# Patient Record
Sex: Female | Born: 1940 | Race: Black or African American | Hispanic: No | Smoking: Never smoker
Health system: Southern US, Community
[De-identification: ages and names within clinical notes are randomized; demographics above are authoritative.]

## PROBLEM LIST (undated history)

## (undated) DIAGNOSIS — M1990 Unspecified osteoarthritis, unspecified site: Secondary | ICD-10-CM

## (undated) DIAGNOSIS — M4800 Spinal stenosis, site unspecified: Secondary | ICD-10-CM

## (undated) DIAGNOSIS — E669 Obesity, unspecified: Secondary | ICD-10-CM

## (undated) DIAGNOSIS — I10 Essential (primary) hypertension: Secondary | ICD-10-CM

## (undated) DIAGNOSIS — G5601 Carpal tunnel syndrome, right upper limb: Secondary | ICD-10-CM

## (undated) DIAGNOSIS — N189 Chronic kidney disease, unspecified: Secondary | ICD-10-CM

## (undated) DIAGNOSIS — E785 Hyperlipidemia, unspecified: Secondary | ICD-10-CM

## (undated) DIAGNOSIS — D649 Anemia, unspecified: Secondary | ICD-10-CM

## (undated) HISTORY — PX: OTHER SURGICAL HISTORY: SHX169

## (undated) HISTORY — PX: JOINT REPLACEMENT: SHX530

## (undated) HISTORY — PX: BREAST SURGERY: SHX581

## (undated) HISTORY — PX: FRACTURE SURGERY: SHX138

## (undated) HISTORY — PX: COLONOSCOPY: SHX174

## (undated) HISTORY — PX: TUBAL LIGATION: SHX77

---

## 1969-08-06 HISTORY — PX: TUBAL LIGATION: SHX77

## 1985-08-06 HISTORY — PX: BREAST SURGERY: SHX581

## 1999-08-07 HISTORY — PX: FRACTURE SURGERY: SHX138

## 2000-01-17 ENCOUNTER — Ambulatory Visit (HOSPITAL_BASED_OUTPATIENT_CLINIC_OR_DEPARTMENT_OTHER): Admission: RE | Admit: 2000-01-17 | Discharge: 2000-01-17 | Payer: Self-pay | Admitting: Orthopedic Surgery

## 2001-08-27 ENCOUNTER — Other Ambulatory Visit: Admission: RE | Admit: 2001-08-27 | Discharge: 2001-08-27 | Payer: Self-pay | Admitting: *Deleted

## 2003-01-19 ENCOUNTER — Other Ambulatory Visit: Admission: RE | Admit: 2003-01-19 | Discharge: 2003-01-19 | Payer: Self-pay | Admitting: Obstetrics and Gynecology

## 2005-01-04 ENCOUNTER — Other Ambulatory Visit: Admission: RE | Admit: 2005-01-04 | Discharge: 2005-01-04 | Payer: Self-pay | Admitting: Family Medicine

## 2006-03-18 ENCOUNTER — Other Ambulatory Visit: Admission: RE | Admit: 2006-03-18 | Discharge: 2006-03-18 | Payer: Self-pay | Admitting: Family Medicine

## 2007-08-13 ENCOUNTER — Other Ambulatory Visit: Admission: RE | Admit: 2007-08-13 | Discharge: 2007-08-13 | Payer: Self-pay | Admitting: Family Medicine

## 2009-08-02 ENCOUNTER — Encounter: Admission: RE | Admit: 2009-08-02 | Discharge: 2009-08-02 | Payer: Self-pay | Admitting: Orthopedic Surgery

## 2010-12-22 NOTE — Op Note (Signed)
[place]. [facility]  Patient:    Sharon Fitzgerald, Sharon Fitzgerald                        MRN: [id number] Proc. Date: 01/17/00 Attending:  Artist Pais. Mina Marble, M.D. CC:         Artist Pais. Mina Marble, M.D. (2)                           Operative Report  PREOPERATIVE DIAGNOSIS:  Left fifth metacarpal fracture at the base.  POSTOPERATIVE DIAGNOSIS:  Left fifth metacarpal fracture at the base.  PROCEDURE:  Open reduction and internal fixation of above fracture using mini-fragment lag screw 1.5 x 12 mm, as well as a T-plate, four-hole, using four 1.5 x 9 mm screws.  SURGEON:  Artist Pais. Mina Marble, M.D.  ANESTHESIA:  General.  TOURNIQUET TIME:  One hour.  COMPLICATIONS:  None.  DRAINS:  None.  DESCRIPTION OF PROCEDURE:  The patient was taken to the operating room and after induction of adequate general anesthesia, the left upper extremity was prepped and draped in the usual sterile fashion.  An Esmarch was used to exsanguinate the limb, and the tourniquet was inflated to 250 mmHg.  At this point in time, a longitudinal incision was made centered between the fourth and fifth metacarpal shafts and bases.  The incision was taken down through the skin and subcutaneous tissues with care to carefully identify and retract a large branch of the ulnar sensory nerve.  Once this was done, the periosteum over the base of the fifth metacarpal was incised longitudinally, thus exposing a short oblique fracture.  The fracture was cleaned of clot and nonviable material using a small rongeur and a dental pick.  Once this was done, reduction was performed and held with a reduction clamp.  At this point in time, a 12 x 1.5 mm mini-fragment screw in lag fashion was placed across the fracture site to stabilize it.  At this point in time, intraoperative fluoroscopy showed a good, adequate reduction in both the AP and lateral plane.  At this point in time, a four-hole T-plate was placed at the base  with the T at the base and the shaft going distally, and fixed using four 9 x 1.5 mm screws.  Intraoperative x-rays showed the reduction in both the AP, lateral, and oblique views.  The wound was copiously irrigated, periosteum was closed with 4-0 Vicryl, and skin with running 4-0 Monocryl.  Steri-Strips, 4 x 4s, fluffs, and compressive dressing were applied.  The patient tolerated the procedure well and went to recovery in stable fashion. DD:  01/17/00 TD:  01/22/00 Job: [id number]

## 2013-01-05 ENCOUNTER — Encounter (HOSPITAL_COMMUNITY): Payer: Self-pay | Admitting: Pharmacy Technician

## 2013-01-08 ENCOUNTER — Encounter (HOSPITAL_COMMUNITY)
Admission: RE | Admit: 2013-01-08 | Discharge: 2013-01-08 | Disposition: A | Discharge status: Home / Self Care | Payer: Medicare PPO | Source: Ambulatory Visit | Attending: Orthopedic Surgery | Admitting: Orthopedic Surgery

## 2013-01-08 ENCOUNTER — Ambulatory Visit (HOSPITAL_COMMUNITY)
Admission: RE | Admit: 2013-01-08 | Discharge: 2013-01-08 | Disposition: A | Discharge status: Home / Self Care | Payer: Medicare PPO | Source: Ambulatory Visit | Attending: Orthopedic Surgery | Admitting: Orthopedic Surgery

## 2013-01-08 ENCOUNTER — Encounter (HOSPITAL_COMMUNITY): Payer: Self-pay

## 2013-01-08 DIAGNOSIS — Z01818 Encounter for other preprocedural examination: Secondary | ICD-10-CM | POA: Insufficient documentation

## 2013-01-08 DIAGNOSIS — I10 Essential (primary) hypertension: Secondary | ICD-10-CM | POA: Insufficient documentation

## 2013-01-08 HISTORY — DX: Unspecified osteoarthritis, unspecified site: M19.90

## 2013-01-08 HISTORY — DX: Essential (primary) hypertension: I10

## 2013-01-08 HISTORY — DX: Spinal stenosis, site unspecified: M48.00

## 2013-01-08 LAB — PROTIME-INR: INR: 1.17 (ref 0.00–1.49)

## 2013-01-08 LAB — APTT: aPTT: 31 seconds (ref 24–37)

## 2013-01-08 LAB — CBC
MCV: 91.2 fL (ref 78.0–100.0)
Platelets: 342 10*3/uL (ref 150–400)
RBC: 4.32 MIL/uL (ref 3.87–5.11)
RDW: 12.4 % (ref 11.5–15.5)
WBC: 9.6 10*3/uL (ref 4.0–10.5)

## 2013-01-08 LAB — URINE MICROSCOPIC-ADD ON

## 2013-01-08 LAB — URINALYSIS, ROUTINE W REFLEX MICROSCOPIC
Bilirubin Urine: NEGATIVE
Glucose, UA: NEGATIVE mg/dL
Ketones, ur: NEGATIVE mg/dL
Protein, ur: NEGATIVE mg/dL
Urobilinogen, UA: 0.2 mg/dL (ref 0.0–1.0)

## 2013-01-08 LAB — BASIC METABOLIC PANEL
CO2: 27 mEq/L (ref 19–32)
Calcium: 9.8 mg/dL (ref 8.4–10.5)
Chloride: 100 mEq/L (ref 96–112)
Creatinine, Ser: 1.21 mg/dL — ABNORMAL HIGH (ref 0.50–1.10)
GFR calc Af Amer: 51 mL/min — ABNORMAL LOW (ref >90)
Sodium: 136 mEq/L (ref 135–145)

## 2013-01-08 NOTE — Patient Instructions (Addendum)
Easton Sivertson Gilliand  01/08/2013   Your procedure is scheduled on: 01-15-2013  Report to [facility] at  10:30AM.  Call this number if you have problems the morning of surgery: [phone]   Remember:   Do not eat food or drink liquids after midnight.   Take these medicines the morning of surgery with A SIP OF WATER: no meds to take   Do not wear jewelry, make-up or nail polish.  Do not wear lotions, powders, or perfumes. You may wear deodorant.  Do not shave 48 hours prior to surgery. Men may shave face and neck.  Do not bring valuables to the hospital.  [facility] is not responsible   for any belongings or valuables.  Contacts, dentures or bridgework may not be worn into surgery.  Leave suitcase in the car. After surgery it may be brought to your room.  For patients admitted to the hospital, checkout time is 11:00 AM the day of  discharge.   Patients discharged the day of surgery will not be allowed to drive  home.  Name and phone number of your driver:   Special Instructions:  Shower using CHG 2 nights before surgery and the night before surgery.  If you shower the day of surgery use CHG.  Use special wash - you have one bottle of CHG for all showers.  You should use approximately 1/3 of the bottle for each shower.   Please read over the following fact sheets that you were given: MRSA Information incentive spirometry  fact sheet, blood fact sheet.

## 2013-01-08 NOTE — Progress Notes (Signed)
EKG 03-18-12 Dr. Clyde Canterbury on chart 01-05-13 medical clearance note on chart

## 2013-01-08 NOTE — H&P (Signed)
TOTAL KNEE ADMISSION H&P  Patient is being admitted for right total knee arthroplasty.  Subjective:  Chief Complaint:   Bilateral knee pain.  HPI: Sharon Fitzgerald, 72 y.o. female, has a history of pain and functional disability in the right knee due to arthritis and has failed non-surgical conservative treatments for greater than 12 weeks to includeNSAID's and/or analgesics, corticosteriod injections and activity modification.  Onset of symptoms was gradual, starting >10 years ago with gradually worsening course since that time. The patient noted no past surgery on the right knee(s).  Patient currently rates pain in the right knee(s) at 9 out of 10 with activity. Patient has worsening of pain with activity and weight bearing, pain that interferes with activities of daily living, pain with passive range of motion, crepitus and sitting for long periods.  Patient has evidence of periarticular osteophytes and joint space narrowing by imaging studies. There is no active infection.  Risks, benefits and expectations were discussed with the patient. Patient understand the risks, benefits and expectations and wishes to proceed with surgery.   D/C Plans:     Home with HHPT  Post-op Meds:    Rx given for ASA, Zanaflex, Iron, Colace and MiraLax  Tranexamic Acid:   To be given  Decadron:    To be given  FYI:    ASA post-op    Past Medical History  Diagnosis Date  . Hypertension   . Arthritis   . Spinal stenosis     sees Dr. Shon Baton    Past Surgical History  Procedure Laterality Date  . Tubal ligation    . Left hand surgury from fracture    . Breast surgery Bilateral     benign fibroid cyst    No prescriptions prior to admission   No Known Allergies   History  Substance Use Topics  . Smoking status: Never Smoker   . Smokeless tobacco: Never Used  . Alcohol Use: Yes     Comment: very rare       Review of Systems  Constitutional: Negative.   HENT: Negative.   Eyes: Negative.    Respiratory: Negative.   Cardiovascular: Negative.   Gastrointestinal: Negative.   Genitourinary: Negative.   Musculoskeletal: Positive for joint pain.  Skin: Negative.   Neurological: Negative.   Endo/Heme/Allergies: Negative.   Psychiatric/Behavioral: Negative.     Objective:  Physical Exam  Constitutional: She is oriented to person, place, and time. She appears well-developed.  HENT:  Head: Normocephalic and atraumatic.  Mouth/Throat: Oropharynx is clear and moist.  Eyes: Pupils are equal, round, and reactive to light.  Neck: Neck supple. No JVD present. No tracheal deviation present. No thyromegaly present.  Cardiovascular: Normal rate, normal heart sounds and intact distal pulses.   Respiratory: Effort normal and breath sounds normal. No stridor. No respiratory distress. She has no wheezes.  GI: Soft. There is no tenderness. There is no guarding.  Musculoskeletal:       Right knee: She exhibits decreased range of motion, swelling, abnormal alignment (mild valgus) and bony tenderness. She exhibits no effusion, no ecchymosis, no deformity, no laceration and no erythema. Tenderness found.  Lymphadenopathy:    She has no cervical adenopathy.  Neurological: She is alert and oriented to person, place, and time.  Skin: Skin is warm and dry.  Psychiatric: She has a normal mood and affect.    Vital signs in last 24 hours: Temp:  [98.6 F (37 C)] 98.6 F (37 C) (06/05 1133) Pulse Rate:  [  86] 86 (06/05 1133) Resp:  [16] 16 (06/05 1133) BP: (169)/(94) 169/94 mmHg (06/05 1133) SpO2:  [97 %] 97 % (06/05 1133) Weight:  [81.647 kg (180 lb)] 81.647 kg (180 lb) (06/05 1133)   Imaging Review Plain radiographs demonstrate severe degenerative joint disease of the right knee(s). The overall alignment ismild valgus. The bone quality appears to be good for age and reported activity level.  Assessment/Plan:  End stage arthritis, right knee   The patient history, physical examination,  clinical judgment of the provider and imaging studies are consistent with end stage degenerative joint disease of the right knee(s) and total knee arthroplasty is deemed medically necessary. The treatment options including medical management, injection therapy arthroscopy and arthroplasty were discussed at length. The risks and benefits of total knee arthroplasty were presented and reviewed. The risks due to aseptic loosening, infection, stiffness, patella tracking problems, thromboembolic complications and other imponderables were discussed. The patient acknowledged the explanation, agreed to proceed with the plan and consent was signed. Patient is being admitted for inpatient treatment for surgery, pain control, PT, OT, prophylactic antibiotics, VTE prophylaxis, progressive ambulation and ADL's and discharge planning. The patient is planning to be discharged home with home health services.    Anastasio Auerbach Pierson Vantol   PAC  01/08/2013, 4:36 PM

## 2013-01-08 NOTE — Progress Notes (Signed)
BMET UA micro results sent to Dr. Charlann Boxer via epic

## 2013-01-10 LAB — URINE CULTURE

## 2013-01-12 NOTE — Progress Notes (Signed)
Fax received and placed on pt chart per dr Charlann Boxer cipro 500 mg bid x 3 days called in for pt. To pharmacy

## 2013-01-12 NOTE — Progress Notes (Signed)
Urine culture results sent to dr Charlann Boxer fax by epic

## 2013-01-14 NOTE — Progress Notes (Signed)
Spoke with pt by phone aware surgery time changed to 1400, clear liquids midnight until 0800 am, then nothing by mouth arrive 1130 am 01-15-13 [facility] short stay

## 2013-01-15 ENCOUNTER — Inpatient Hospital Stay (HOSPITAL_COMMUNITY)
Admission: RE | Admit: 2013-01-15 | Discharge: 2013-01-16 | DRG: 470 | Disposition: A | Discharge status: Home Health | Payer: Medicare PPO | Source: Ambulatory Visit | Attending: Orthopedic Surgery | Admitting: Orthopedic Surgery

## 2013-01-15 ENCOUNTER — Encounter (HOSPITAL_COMMUNITY): Payer: Self-pay | Admitting: *Deleted

## 2013-01-15 ENCOUNTER — Encounter (HOSPITAL_COMMUNITY)
Admission: RE | Disposition: A | Discharge status: Home Health | Payer: Self-pay | Source: Ambulatory Visit | Attending: Orthopedic Surgery

## 2013-01-15 ENCOUNTER — Inpatient Hospital Stay (HOSPITAL_COMMUNITY): Payer: Medicare PPO | Admitting: Anesthesiology

## 2013-01-15 ENCOUNTER — Encounter (HOSPITAL_COMMUNITY): Payer: Self-pay | Admitting: Anesthesiology

## 2013-01-15 DIAGNOSIS — I10 Essential (primary) hypertension: Secondary | ICD-10-CM | POA: Diagnosis present

## 2013-01-15 DIAGNOSIS — E669 Obesity, unspecified: Secondary | ICD-10-CM | POA: Diagnosis present

## 2013-01-15 DIAGNOSIS — M1710 Unilateral primary osteoarthritis, unspecified knee: Principal | ICD-10-CM | POA: Diagnosis present

## 2013-01-15 DIAGNOSIS — Z96659 Presence of unspecified artificial knee joint: Secondary | ICD-10-CM

## 2013-01-15 DIAGNOSIS — D500 Iron deficiency anemia secondary to blood loss (chronic): Secondary | ICD-10-CM | POA: Diagnosis not present

## 2013-01-15 DIAGNOSIS — Z96651 Presence of right artificial knee joint: Secondary | ICD-10-CM

## 2013-01-15 HISTORY — PX: TOTAL KNEE ARTHROPLASTY: SHX125

## 2013-01-15 LAB — TYPE AND SCREEN
ABO/RH(D): A POS
Antibody Screen: NEGATIVE

## 2013-01-15 SURGERY — ARTHROPLASTY, KNEE, TOTAL
Anesthesia: Spinal | Site: Knee | Laterality: Right | Wound class: Clean

## 2013-01-15 MED ORDER — DEXAMETHASONE SODIUM PHOSPHATE 10 MG/ML IJ SOLN
10.0000 mg | Freq: Once | INTRAMUSCULAR | Status: DC
  Filled 2013-01-15: qty 1

## 2013-01-15 MED ORDER — BUPIVACAINE LIPOSOME 1.3 % IJ SUSP
20.0000 mL | Freq: Once | INTRAMUSCULAR | Status: DC
  Filled 2013-01-15: qty 20

## 2013-01-15 MED ORDER — ASPIRIN EC 325 MG PO TBEC
325.0000 mg | DELAYED_RELEASE_TABLET | Freq: Two times a day (BID) | ORAL | Status: DC
  Filled 2013-01-15 (×4): qty 1

## 2013-01-15 MED ORDER — POLYETHYLENE GLYCOL 3350 17 G PO PACK
17.0000 g | PACK | Freq: Two times a day (BID) | ORAL | Status: DC
  Administered 2013-01-15 – 2013-01-16 (×2): 17 g via ORAL
  Filled 2013-01-15: qty 1

## 2013-01-15 MED ORDER — CEFAZOLIN SODIUM-DEXTROSE 2-3 GM-% IV SOLR
2.0000 g | INTRAVENOUS | Status: AC
  Administered 2013-01-15: 2 g via INTRAVENOUS

## 2013-01-15 MED ORDER — MENTHOL 3 MG MT LOZG: 1.0000 | LOZENGE | OROMUCOSAL | Status: DC | PRN

## 2013-01-15 MED ORDER — SODIUM CHLORIDE 0.9 % IV SOLN
INTRAVENOUS | Status: DC
  Administered 2013-01-15: 19:00:00 via INTRAVENOUS
  Filled 2013-01-15 (×4): qty 1000

## 2013-01-15 MED ORDER — BUPIVACAINE-EPINEPHRINE 0.25% -1:200000 IJ SOLN
INTRAMUSCULAR | Status: DC | PRN
  Administered 2013-01-15: 25 mL

## 2013-01-15 MED ORDER — ZOLPIDEM TARTRATE 5 MG PO TABS
5.0000 mg | ORAL_TABLET | Freq: Every evening | ORAL | Status: DC | PRN
  Administered 2013-01-15: 5 mg via ORAL
  Filled 2013-01-15: qty 1

## 2013-01-15 MED ORDER — METOCLOPRAMIDE HCL 5 MG/ML IJ SOLN: 5.0000 mg | Freq: Three times a day (TID) | INTRAMUSCULAR | Status: DC | PRN

## 2013-01-15 MED ORDER — TRANEXAMIC ACID 100 MG/ML IV SOLN
1000.0000 mg | Freq: Once | INTRAVENOUS | Status: AC
  Administered 2013-01-15: 1000 mg via INTRAVENOUS
  Filled 2013-01-15: qty 10

## 2013-01-15 MED ORDER — CEFAZOLIN SODIUM-DEXTROSE 2-3 GM-% IV SOLR
2.0000 g | Freq: Four times a day (QID) | INTRAVENOUS | Status: AC
  Administered 2013-01-15 – 2013-01-16 (×2): 2 g via INTRAVENOUS
  Filled 2013-01-15 (×2): qty 50

## 2013-01-15 MED ORDER — FLEET ENEMA 7-19 GM/118ML RE ENEM: 1.0000 | ENEMA | Freq: Once | RECTAL | Status: AC | PRN

## 2013-01-15 MED ORDER — ONDANSETRON HCL 4 MG PO TABS: 4.0000 mg | ORAL_TABLET | Freq: Four times a day (QID) | ORAL | Status: DC | PRN

## 2013-01-15 MED ORDER — METHOCARBAMOL 100 MG/ML IJ SOLN
500.0000 mg | Freq: Four times a day (QID) | INTRAVENOUS | Status: DC | PRN
  Filled 2013-01-15: qty 5

## 2013-01-15 MED ORDER — ONDANSETRON HCL 4 MG/2ML IJ SOLN: 4.0000 mg | Freq: Four times a day (QID) | INTRAMUSCULAR | Status: DC | PRN

## 2013-01-15 MED ORDER — METHOCARBAMOL 500 MG PO TABS: 500.0000 mg | ORAL_TABLET | Freq: Four times a day (QID) | ORAL | Status: DC | PRN

## 2013-01-15 MED ORDER — CONJ ESTROG-MEDROXYPROGEST ACE 0.625-2.5 MG PO TABS
1.0000 | ORAL_TABLET | Freq: Every day | ORAL | Status: DC
  Filled 2013-01-15: qty 1

## 2013-01-15 MED ORDER — BUPIVACAINE LIPOSOME 1.3 % IJ SUSP
INTRAMUSCULAR | Status: DC | PRN
  Administered 2013-01-15: 20 mL

## 2013-01-15 MED ORDER — CELECOXIB 200 MG PO CAPS
200.0000 mg | ORAL_CAPSULE | Freq: Two times a day (BID) | ORAL | Status: DC
  Administered 2013-01-15 – 2013-01-16 (×2): 200 mg via ORAL
  Filled 2013-01-15 (×3): qty 1

## 2013-01-15 MED ORDER — HYDROMORPHONE HCL PF 1 MG/ML IJ SOLN: 0.2500 mg | INTRAMUSCULAR | Status: DC | PRN

## 2013-01-15 MED ORDER — PNEUMOCOCCAL VAC POLYVALENT 25 MCG/0.5ML IJ INJ
0.5000 mL | INJECTION | INTRAMUSCULAR | Status: DC
  Filled 2013-01-15 (×2): qty 0.5

## 2013-01-15 MED ORDER — DEXAMETHASONE SODIUM PHOSPHATE 10 MG/ML IJ SOLN
10.0000 mg | Freq: Once | INTRAMUSCULAR | Status: AC
  Administered 2013-01-15: 10 mg via INTRAVENOUS

## 2013-01-15 MED ORDER — KETOROLAC TROMETHAMINE 30 MG/ML IJ SOLN
INTRAMUSCULAR | Status: DC | PRN
  Administered 2013-01-15: 30 mg via INTRAVENOUS

## 2013-01-15 MED ORDER — BISACODYL 10 MG RE SUPP: 10.0000 mg | Freq: Every day | RECTAL | Status: DC | PRN

## 2013-01-15 MED ORDER — HYDROMORPHONE HCL PF 1 MG/ML IJ SOLN
0.5000 mg | INTRAMUSCULAR | Status: DC | PRN
  Administered 2013-01-15: 0.5 mg via INTRAVENOUS
  Filled 2013-01-15: qty 1

## 2013-01-15 MED ORDER — FENTANYL CITRATE 0.05 MG/ML IJ SOLN
INTRAMUSCULAR | Status: DC | PRN
  Administered 2013-01-15: 100 ug via INTRAVENOUS

## 2013-01-15 MED ORDER — DIPHENHYDRAMINE HCL 25 MG PO CAPS: 25.0000 mg | ORAL_CAPSULE | Freq: Four times a day (QID) | ORAL | Status: DC | PRN

## 2013-01-15 MED ORDER — METOCLOPRAMIDE HCL 10 MG PO TABS: 5.0000 mg | ORAL_TABLET | Freq: Three times a day (TID) | ORAL | Status: DC | PRN

## 2013-01-15 MED ORDER — SODIUM CHLORIDE 0.9 % IJ SOLN
INTRAMUSCULAR | Status: DC | PRN
  Administered 2013-01-15: 14 mL via INTRAVENOUS

## 2013-01-15 MED ORDER — PROPOFOL INFUSION 10 MG/ML OPTIME
INTRAVENOUS | Status: DC | PRN
  Administered 2013-01-15: 75 ug/kg/min via INTRAVENOUS

## 2013-01-15 MED ORDER — CHLORTHALIDONE 25 MG PO TABS
25.0000 mg | ORAL_TABLET | Freq: Every day | ORAL | Status: DC
  Administered 2013-01-16: 25 mg via ORAL
  Filled 2013-01-15: qty 1

## 2013-01-15 MED ORDER — DOCUSATE SODIUM 100 MG PO CAPS
100.0000 mg | ORAL_CAPSULE | Freq: Two times a day (BID) | ORAL | Status: DC
  Administered 2013-01-15 – 2013-01-16 (×2): 100 mg via ORAL

## 2013-01-15 MED ORDER — ONDANSETRON HCL 4 MG/2ML IJ SOLN
INTRAMUSCULAR | Status: DC | PRN
  Administered 2013-01-15: 4 mg via INTRAVENOUS

## 2013-01-15 MED ORDER — PHENOL 1.4 % MT LIQD: 1.0000 | OROMUCOSAL | Status: DC | PRN

## 2013-01-15 MED ORDER — ALUM & MAG HYDROXIDE-SIMETH 200-200-20 MG/5ML PO SUSP: 30.0000 mL | ORAL | Status: DC | PRN

## 2013-01-15 MED ORDER — HYDROCODONE-ACETAMINOPHEN 7.5-325 MG PO TABS
1.0000 | ORAL_TABLET | ORAL | Status: DC
  Administered 2013-01-15: 1 via ORAL
  Administered 2013-01-16: 2 via ORAL
  Administered 2013-01-16 (×3): 1 via ORAL
  Filled 2013-01-15: qty 1
  Filled 2013-01-15: qty 2
  Filled 2013-01-15 (×3): qty 1

## 2013-01-15 MED ORDER — LACTATED RINGERS IV SOLN
INTRAVENOUS | Status: DC | PRN
  Administered 2013-01-15 (×3): via INTRAVENOUS

## 2013-01-15 MED ORDER — MIDAZOLAM HCL 5 MG/5ML IJ SOLN
INTRAMUSCULAR | Status: DC | PRN
  Administered 2013-01-15: 2 mg via INTRAVENOUS

## 2013-01-15 MED ORDER — FERROUS SULFATE 325 (65 FE) MG PO TABS
325.0000 mg | ORAL_TABLET | Freq: Three times a day (TID) | ORAL | Status: DC
  Administered 2013-01-16 (×2): 325 mg via ORAL
  Filled 2013-01-15 (×4): qty 1

## 2013-01-15 SURGICAL SUPPLY — 55 items
BAG ZIPLOCK 12X15 (MISCELLANEOUS) ×2 IMPLANT
BANDAGE ELASTIC 6 VELCRO ST LF (GAUZE/BANDAGES/DRESSINGS) ×2 IMPLANT
BANDAGE ESMARK 6X9 LF (GAUZE/BANDAGES/DRESSINGS) ×1 IMPLANT
BLADE SAW SGTL 13.0X1.19X90.0M (BLADE) ×2 IMPLANT
BNDG ESMARK 6X9 LF (GAUZE/BANDAGES/DRESSINGS) ×2
BOWL SMART MIX CTS (DISPOSABLE) ×2 IMPLANT
CAPT RP KNEE ×2 IMPLANT
CEMENT HV SMART SET (Cement) ×4 IMPLANT
CLOTH BEACON ORANGE TIMEOUT ST (SAFETY) ×2 IMPLANT
CUFF TOURN SGL QUICK 34 (TOURNIQUET CUFF) ×1
CUFF TRNQT CYL 34X4X40X1 (TOURNIQUET CUFF) ×1 IMPLANT
DECANTER SPIKE VIAL GLASS SM (MISCELLANEOUS) ×2 IMPLANT
DERMABOND ADVANCED (GAUZE/BANDAGES/DRESSINGS) ×1
DERMABOND ADVANCED .7 DNX12 (GAUZE/BANDAGES/DRESSINGS) ×1 IMPLANT
DRAPE EXTREMITY T 121X128X90 (DRAPE) ×2 IMPLANT
DRAPE POUCH INSTRU U-SHP 10X18 (DRAPES) ×2 IMPLANT
DRAPE U-SHAPE 47X51 STRL (DRAPES) ×2 IMPLANT
DRSG AQUACEL AG ADV 3.5X10 (GAUZE/BANDAGES/DRESSINGS) ×2 IMPLANT
DRSG TEGADERM 4X4.75 (GAUZE/BANDAGES/DRESSINGS) ×2 IMPLANT
DURAPREP 26ML APPLICATOR (WOUND CARE) ×2 IMPLANT
ELECT REM PT RETURN 9FT ADLT (ELECTROSURGICAL) ×2
ELECTRODE REM PT RTRN 9FT ADLT (ELECTROSURGICAL) ×1 IMPLANT
EVACUATOR 1/8 PVC DRAIN (DRAIN) ×2 IMPLANT
FACESHIELD LNG OPTICON STERILE (SAFETY) ×10 IMPLANT
GAUZE SPONGE 2X2 8PLY STRL LF (GAUZE/BANDAGES/DRESSINGS) ×1 IMPLANT
GLOVE BIOGEL PI IND STRL 7.5 (GLOVE) ×1 IMPLANT
GLOVE BIOGEL PI IND STRL 8 (GLOVE) ×1 IMPLANT
GLOVE BIOGEL PI INDICATOR 7.5 (GLOVE) ×1
GLOVE BIOGEL PI INDICATOR 8 (GLOVE) ×1
GLOVE ECLIPSE 8.0 STRL XLNG CF (GLOVE) ×2 IMPLANT
GLOVE ORTHO TXT STRL SZ7.5 (GLOVE) ×4 IMPLANT
GOWN BRE IMP PREV XXLGXLNG (GOWN DISPOSABLE) ×2 IMPLANT
GOWN STRL NON-REIN LRG LVL3 (GOWN DISPOSABLE) ×2 IMPLANT
HANDPIECE INTERPULSE COAX TIP (DISPOSABLE) ×1
KIT BASIN OR (CUSTOM PROCEDURE TRAY) ×2 IMPLANT
MANIFOLD NEPTUNE II (INSTRUMENTS) ×2 IMPLANT
NDL SAFETY ECLIPSE 18X1.5 (NEEDLE) ×1 IMPLANT
NEEDLE HYPO 18GX1.5 SHARP (NEEDLE) ×1
NS IRRIG 1000ML POUR BTL (IV SOLUTION) ×4 IMPLANT
PACK TOTAL JOINT (CUSTOM PROCEDURE TRAY) ×2 IMPLANT
POSITIONER SURGICAL ARM (MISCELLANEOUS) ×2 IMPLANT
SET HNDPC FAN SPRY TIP SCT (DISPOSABLE) ×1 IMPLANT
SET PAD KNEE POSITIONER (MISCELLANEOUS) ×2 IMPLANT
SPONGE GAUZE 2X2 STER 10/PKG (GAUZE/BANDAGES/DRESSINGS) ×1
SUCTION FRAZIER 12FR DISP (SUCTIONS) ×2 IMPLANT
SUT MNCRL AB 4-0 PS2 18 (SUTURE) ×2 IMPLANT
SUT VIC AB 1 CT1 36 (SUTURE) ×2 IMPLANT
SUT VIC AB 2-0 CT1 27 (SUTURE) ×3
SUT VIC AB 2-0 CT1 TAPERPNT 27 (SUTURE) ×3 IMPLANT
SUT VLOC 180 0 24IN GS25 (SUTURE) ×2 IMPLANT
SYR 50ML LL SCALE MARK (SYRINGE) ×2 IMPLANT
TOWEL OR 17X26 10 PK STRL BLUE (TOWEL DISPOSABLE) ×4 IMPLANT
TRAY FOLEY CATH 14FRSI W/METER (CATHETERS) ×2 IMPLANT
WATER STERILE IRR 1500ML POUR (IV SOLUTION) ×2 IMPLANT
WRAP KNEE MAXI GEL POST OP (GAUZE/BANDAGES/DRESSINGS) ×2 IMPLANT

## 2013-01-15 NOTE — Anesthesia Postprocedure Evaluation (Signed)
  Anesthesia Post-op Note  Patient: Sharon Fitzgerald  Procedure(s) Performed: Procedure(s) (LRB): RIGHT TOTAL KNEE ARTHROPLASTY (Right)  Patient Location: PACU  Anesthesia Type: SAB  Level of Consciousness: awake and alert   Airway and Oxygen Therapy: Patient Spontanous Breathing  Post-op Pain: mild  Post-op Assessment: Post-op Vital signs reviewed, Patient's Cardiovascular Status Stable, Respiratory Function Stable, Patent Airway and No signs of Nausea or vomiting  Last Vitals:  Filed Vitals:   01/15/13 1715  BP: 155/86  Pulse: 71  Temp: 36.6 C  Resp: 16    Post-op Vital Signs: stable   Complications: No apparent anesthesia complications

## 2013-01-15 NOTE — Preoperative (Signed)
Beta Blockers   Reason not to administer Beta Blockers:Not Applicable 

## 2013-01-15 NOTE — Anesthesia Procedure Notes (Signed)
Spinal  Patient location during procedure: OR Start time: 01/15/2013 1:46 PM End time: 01/15/2013 1:50 PM Staffing Performed by: anesthesiologist  Preanesthetic Checklist Completed: patient identified, site marked, surgical consent, pre-op evaluation, timeout performed, IV checked, risks and benefits discussed and monitors and equipment checked Spinal Block Patient position: sitting Prep: ChloraPrep Patient monitoring: heart rate, continuous pulse ox and blood pressure Location: L3-4 Injection technique: single-shot Needle Needle type: Quincke  Needle gauge: 22 G Needle length: 9 cm Assessment Sensory level: T8 Additional Notes Expiration date of kit checked and confirmed. Patient tolerated procedure well, without complications.

## 2013-01-15 NOTE — Interval H&P Note (Signed)
History and Physical Interval Note:  01/15/2013 1:33 PM  Sharon Fitzgerald  has presented today for surgery, with the diagnosis of RIGHT KNEE OA  The various methods of treatment have been discussed with the patient and family. After consideration of risks, benefits and other options for treatment, the patient has consented to  Procedure(s): RIGHT TOTAL KNEE ARTHROPLASTY (Right) as a surgical intervention .  The patient's history has been reviewed, patient examined, no change in status, stable for surgery.  I have reviewed the patient's chart and labs.  Questions were answered to the patient's satisfaction.     Shelda Pal

## 2013-01-15 NOTE — Transfer of Care (Signed)
Immediate Anesthesia Transfer of Care Note  Patient: Sharon Fitzgerald  Procedure(s) Performed: Procedure(s): RIGHT TOTAL KNEE ARTHROPLASTY (Right)  Patient Location: PACU  Anesthesia Type:Regional  Level of Consciousness: awake, alert  and oriented  Airway & Oxygen Therapy: Patient Spontanous Breathing and Patient connected to face mask oxygen  Post-op Assessment: Report given to PACU RN and Post -op Vital signs reviewed and stable  Post vital signs: Reviewed and stable  Complications: No apparent anesthesia complications

## 2013-01-15 NOTE — Anesthesia Preprocedure Evaluation (Addendum)
Anesthesia Evaluation  Patient identified by MRN, date of birth, ID band Patient awake    Reviewed: Allergy & Precautions, H&P , NPO status , Patient's Chart, lab work & pertinent test results  Airway       Dental   Pulmonary neg pulmonary ROS,          Cardiovascular hypertension,     Neuro/Psych negative neurological ROS  negative psych ROS   GI/Hepatic negative GI ROS, Neg liver ROS,   Endo/Other  negative endocrine ROS  Renal/GU negative Renal ROS     Musculoskeletal negative musculoskeletal ROS (+)   Abdominal   Peds  Hematology negative hematology ROS (+)   Anesthesia Other Findings   Reproductive/Obstetrics negative OB ROS                         Anesthesia Physical Anesthesia Plan  ASA: II  Anesthesia Plan:    Post-op Pain Management:    Induction: Intravenous  Airway Management Planned:   Additional Equipment:   Intra-op Plan:   Post-operative Plan:   Informed Consent:   Dental advisory given  Plan Discussed with: CRNA  Anesthesia Plan Comments:         Anesthesia Quick Evaluation

## 2013-01-15 NOTE — Op Note (Signed)
NAME:  Sharon Fitzgerald [facility]                      MEDICAL RECORD NO.:  [id number]                             FACILITY:  [facility]      PHYSICIAN:  Madlyn Frankel. Charlann Boxer, M.D.  DATE OF BIRTH:  Jul 01, 1941      DATE OF PROCEDURE:  01/15/2013                                     OPERATIVE REPORT         PREOPERATIVE DIAGNOSIS:  Right knee osteoarthritis.      POSTOPERATIVE DIAGNOSIS:  Right knee osteoarthritis.      FINDINGS:  The patient was noted to have complete loss of cartilage and   bone-on-bone arthritis with associated osteophytes in the lateral and patellofemoral compartments of   the knee with a significant synovitis and associated effusion.      PROCEDURE:  Right total knee replacement.      COMPONENTS USED:  DePuy rotating platform posterior stabilized knee   system, a size 2.5 femur, 3 tibia, 12.5 mm PS insert, and 35 patellar   button.      SURGEON:  Madlyn Frankel. Charlann Boxer, M.D.      ASSISTANT:  Lanney Gins, PA-C.      ANESTHESIA:  Spinal.      SPECIMENS:  None.      COMPLICATION:  None.      DRAINS:  One Hemovac.  EBL: <100cc      TOURNIQUET TIME:   Total Tourniquet Time Documented: Thigh (Right) - 44 minutes Total: Thigh (Right) - 44 minutes  .      The patient was stable to the recovery room.      INDICATION FOR PROCEDURE:  Sharon Fitzgerald is a 72 y.o. female patient of   mine.  The patient had been seen, evaluated, and treated conservatively in the   office with medication, activity modification, and injections.  The patient had   radiographic changes of bone-on-bone arthritis with endplate sclerosis and osteophytes noted.      The patient failed conservative measures including medication, injections, and activity modification, and at this point was ready for more definitive measures.   Based on the radiographic changes and failed conservative measures, the patient   decided to proceed with total knee replacement.  Risks of infection,   DVT, component failure, need  for revision surgery, postop course, and   expectations were all   discussed and reviewed.  Consent was obtained for benefit of pain   relief.      PROCEDURE IN DETAIL:  The patient was brought to the operative theater.   Once adequate anesthesia, preoperative antibiotics, 2 gm of Ancef administered, the patient was positioned supine with the right thigh tourniquet placed.  The  right lower extremity was prepped and draped in sterile fashion.  A time-   out was performed identifying the patient, planned procedure, and   extremity.      The right lower extremity was placed in the [facility] leg holder.  The leg was   exsanguinated, tourniquet elevated to 250 mmHg.  A midline incision was   made followed by median parapatellar arthrotomy.  Following initial   exposure, attention was  first directed to the patella.  Precut   measurement was noted to be 22 mm.  I resected down to 13-14 mm and used a   35 patellar button to restore patellar height as well as cover the cut   surface.      The lug holes were drilled and a metal shim was placed to protect the   patella from retractors and saw blades.      At this point, attention was now directed to the femur.  The femoral   canal was opened with a drill, irrigated to try to prevent fat emboli.  An   intramedullary rod was passed at 5 degrees valgus, 10 mm of bone was   resected off the distal femur.  Following this resection, the tibia was   subluxated anteriorly.  Using the extramedullary guide, 6 mm of bone was resected off   the proximal lateral tibia.  We confirmed the gap would be   stable medially and laterally with a 10 mm insert as well as confirmed   the cut was perpendicular in the coronal plane, checking with an alignment rod.      Once this was done, I sized the femur to be a size 2.5 in the anterior-   posterior dimension, chose a standard component based on medial and   lateral dimension.  The size 2.5 rotation block was then  pinned in   position anterior referenced using the C-clamp to set rotation.  The   anterior, posterior, and  chamfer cuts were made without difficulty nor   notching making certain that I was along the anterior cortex to help   with flexion gap stability.      The final box cut was made off the lateral aspect of distal femur.      At this point, the tibia was sized to be a size 3, the size 3 tray was   then pinned in position through the medial third of the tubercle,   drilled, and keel punched.  Trial reduction was now carried with a 2.5 femur,  3 tibia, a 12.5 mm insert, and the 35 patella botton.  The knee was brought to   extension, full extension with good flexion stability with the patella   tracking through the trochlea without application of pressure.  Given   all these findings, the trial components removed.  Final components were   opened and cement was mixed.  The knee was irrigated with normal saline   solution and pulse lavage.  The synovial lining was   then injected with 0.25% Marcaine with epinephrine and 1 cc of Toradol,   total of 61 cc.      The knee was irrigated.  Final implants were then cemented onto clean and   dried cut surfaces of bone with the knee brought to extension with a 12.5 mm trial insert.      Once the cement had fully cured, the excess cement was removed   throughout the knee.  I confirmed I was satisfied with the range of   motion and stability, and the final 12.5 mm PS insert was chosen.  It was   placed into the knee.      The tourniquet had been let down at 44 minutes.  No significant   hemostasis required.  The medium Hemovac drain was placed deep.  The   extensor mechanism was then reapproximated using #1 Vicryl with the knee   in flexion.  The  remaining wound was closed with 2-0 Vicryl and running 4-0 Monocryl.   The knee was cleaned, dried, dressed sterilely using Dermabond and   Aquacel dressing.  Drain site dressed separately.  The  patient was then   brought to recovery room in stable condition, tolerating the procedure   well.   Please note that Physician Assistant, Lanney Gins, was present for the entirety of the case, and was utilized for pre-operative positioning, peri-operative retractor management, general facilitation of the procedure.  He was also utilized for primary wound closure at the end of the case.              Madlyn Frankel Charlann Boxer, M.D.

## 2013-01-16 ENCOUNTER — Encounter (HOSPITAL_COMMUNITY): Payer: Self-pay | Admitting: Orthopedic Surgery

## 2013-01-16 DIAGNOSIS — E669 Obesity, unspecified: Secondary | ICD-10-CM | POA: Diagnosis present

## 2013-01-16 DIAGNOSIS — D500 Iron deficiency anemia secondary to blood loss (chronic): Secondary | ICD-10-CM | POA: Diagnosis not present

## 2013-01-16 LAB — CBC
HCT: 32 % — ABNORMAL LOW (ref 36.0–46.0)
Hemoglobin: 10.3 g/dL — ABNORMAL LOW (ref 12.0–15.0)
MCH: 29 pg (ref 26.0–34.0)
MCHC: 32.2 g/dL (ref 30.0–36.0)
MCV: 90.1 fL (ref 78.0–100.0)

## 2013-01-16 LAB — BASIC METABOLIC PANEL
BUN: 17 mg/dL (ref 6–23)
Calcium: 8.6 mg/dL (ref 8.4–10.5)
Creatinine, Ser: 1.07 mg/dL (ref 0.50–1.10)
GFR calc non Af Amer: 51 mL/min — ABNORMAL LOW (ref >90)
Glucose, Bld: 131 mg/dL — ABNORMAL HIGH (ref 70–99)

## 2013-01-16 MED ORDER — TIZANIDINE HCL 4 MG PO CAPS: 4.0000 mg | ORAL_CAPSULE | Freq: Three times a day (TID) | ORAL | Status: DC | PRN

## 2013-01-16 MED ORDER — FERROUS SULFATE 325 (65 FE) MG PO TABS: 325.0000 mg | ORAL_TABLET | Freq: Three times a day (TID) | ORAL | Status: DC

## 2013-01-16 MED ORDER — MEDROXYPROGESTERONE ACETATE 2.5 MG PO TABS
2.5000 mg | ORAL_TABLET | Freq: Every day | ORAL | Status: DC
  Filled 2013-01-16: qty 1

## 2013-01-16 MED ORDER — POLYETHYLENE GLYCOL 3350 17 G PO PACK: 17.0000 g | PACK | Freq: Two times a day (BID) | ORAL | Status: DC

## 2013-01-16 MED ORDER — ASPIRIN 325 MG PO TBEC: 325.0000 mg | DELAYED_RELEASE_TABLET | Freq: Two times a day (BID) | ORAL | Status: DC

## 2013-01-16 MED ORDER — ESTROGENS CONJUGATED 0.625 MG PO TABS
0.6250 mg | ORAL_TABLET | Freq: Every day | ORAL | Status: DC
  Filled 2013-01-16: qty 1

## 2013-01-16 MED ORDER — DSS 100 MG PO CAPS: 100.0000 mg | ORAL_CAPSULE | Freq: Two times a day (BID) | ORAL | Status: DC

## 2013-01-16 MED ORDER — HYDROCODONE-ACETAMINOPHEN 7.5-325 MG PO TABS: 1.0000 | ORAL_TABLET | ORAL | Status: DC

## 2013-01-16 NOTE — Progress Notes (Signed)
Patient d/c home,stable.- Nain Rudd RN 

## 2013-01-16 NOTE — Progress Notes (Signed)
Utilization review completed.  

## 2013-01-16 NOTE — Progress Notes (Signed)
D/c instructions given, patient verbalized understanding,prescriptions given,patient is stable,alert and oriented, denies pain.Hulda Marin RN

## 2013-01-16 NOTE — Evaluation (Signed)
Physical Therapy Evaluation Patient Details Name: Sharon Fitzgerald MRN: [id number] DOB: [birth year] Today's Date: 01/16/2013 Time: 1478-2956 PT Time Calculation (min): 29 min  PT Assessment / Plan / Recommendation Clinical Impression  Pt s/p R TKR presents with decreased R LE strength/ROM, post op pain and spinal stenosis limiting functional mobility    PT Assessment  Patient needs continued PT services    Follow Up Recommendations  Home health PT    Does the patient have the potential to tolerate intense rehabilitation      Barriers to Discharge None      Equipment Recommendations  None recommended by PT    Recommendations for Other Services OT consult   Frequency 7X/week    Precautions / Restrictions Precautions Precautions: Knee Restrictions Weight Bearing Restrictions: No Other Position/Activity Restrictions: WBAT   Pertinent Vitals/Pain 6/10 with activity; premed, cold pack provided      Mobility  Bed Mobility Bed Mobility: Sit to Supine Supine to Sit: 4: Min assist Sit to Supine: 4: Min assist Details for Bed Mobility Assistance: flat bed, no cues needed Transfers Transfers: Sit to Stand;Stand to Sit Sit to Stand: 4: Min guard;From bed;From chair/3-in-1;With upper extremity assist Stand to Sit: 4: Min guard;With upper extremity assist;To chair/3-in-1;To bed Details for Transfer Assistance: cues for hand placement Ambulation/Gait Ambulation/Gait Assistance: 4: Min assist Ambulation Distance (Feet): 27 Feet Assistive device: Rolling walker Ambulation/Gait Assistance Details: cues for sequence, posture, position from RW and stride length Gait Pattern: Step-to pattern General Gait Details: ltd by c/o back fatigue 2* spinal stenosis Stairs: No    Exercises Total Joint Exercises Ankle Circles/Pumps: AROM;10 reps;Supine;Both Quad Sets: AROM;Both;10 reps;Supine Heel Slides: AAROM;15 reps;Supine;Right Straight Leg Raises: AROM;AAROM;Right;Supine;15 reps   PT  Diagnosis: Difficulty walking  PT Problem List: Decreased strength;Decreased range of motion;Decreased activity tolerance;Decreased mobility;Decreased knowledge of use of DME;Pain PT Treatment Interventions: DME instruction;Gait training;Stair training;Functional mobility training;Therapeutic activities;Therapeutic exercise;Patient/family education   PT Goals Acute Rehab PT Goals PT Goal Formulation: With patient Time For Goal Achievement: 01/21/13 Potential to Achieve Goals: Good Pt will go Supine/Side to Sit: with supervision PT Goal: Supine/Side to Sit - Progress: Goal set today Pt will go Sit to Supine/Side: with supervision PT Goal: Sit to Supine/Side - Progress: Goal set today Pt will go Sit to Stand: with supervision PT Goal: Sit to Stand - Progress: Goal set today Pt will go Stand to Sit: with supervision PT Goal: Stand to Sit - Progress: Goal set today Pt will Ambulate: 51 - 150 feet;with supervision;with rolling walker PT Goal: Ambulate - Progress: Goal set today Pt will Go Up / Down Stairs: 6-9 stairs;with min assist;with least restrictive assistive device PT Goal: Up/Down Stairs - Progress: Goal set today  Visit Information  Last PT Received On: 01/16/13 Assistance Needed: +1    Subjective Data  Subjective: I'm getting the other knee done in a few weeks Patient Stated Goal: Resume previous lifestyle with decreased pain   Prior Functioning  Home Living Lives With: Alone Available Help at Discharge: Friend(s) (coming to stay, 24/7) Type of Home: House Home Access: Stairs to enter [employer] of Steps: 6 Entrance Stairs-Rails: Right;Left;Can reach both Home Layout: Multi-level Alternate Level Stairs-Number of Steps: 6 Alternate Level Stairs-Rails: Right;Left;Can reach both Bathroom Shower/Tub: Health visitor: Pharmacist, community:  (handicapped in hall) Home Adaptive Equipment: Straight cane;Walker - rolling Additional  Comments: Pt states can borrow RW from family member Prior Function Level of Independence: Independent Able to Take Stairs?: Yes Driving: Yes  Vocation: Retired Musician: No difficulties Dominant Hand: Right    Cognition  Cognition Arousal/Alertness: Awake/alert Behavior During Therapy: WFL for tasks assessed/performed Overall Cognitive Status:  (decreased safety)    Extremity/Trunk Assessment Right Upper Extremity Assessment RUE ROM/Strength/Tone: WFL for tasks assessed Left Upper Extremity Assessment LUE ROM/Strength/Tone: WFL for tasks assessed Right Lower Extremity Assessment RLE ROM/Strength/Tone: Deficits RLE ROM/Strength/Tone Deficits: 3/5 quads with AAROM -10 - 95 Left Lower Extremity Assessment LLE ROM/Strength/Tone: WFL for tasks assessed   Balance    End of Session PT - End of Session Equipment Utilized During Treatment: Gait belt Activity Tolerance: Patient tolerated treatment well Patient left: in chair;with call bell/phone within reach;with family/visitor present Nurse Communication: Mobility status  GP     Sharon Fitzgerald 01/16/2013, 11:37 AM

## 2013-01-16 NOTE — Progress Notes (Signed)
Physical Therapy Treatment Patient Details Name: Sharon Fitzgerald MRN: [id number] DOB: 1940-12-17 Today's Date: 01/16/2013 Time: 4098-1191 PT Time Calculation (min): 23 min  PT Assessment / Plan / Recommendation Comments on Treatment Session  Reviewed car transfers and ltd home ther ex program with pt     Follow Up Recommendations  Home health PT     Does the patient have the potential to tolerate intense rehabilitation     Barriers to Discharge        Equipment Recommendations  None recommended by PT    Recommendations for Other Services OT consult  Frequency 7X/week   Plan Discharge plan remains appropriate    Precautions / Restrictions Precautions Precautions: Knee Restrictions Weight Bearing Restrictions: No Other Position/Activity Restrictions: WBAT   Pertinent Vitals/Pain Min c/o pain    Mobility  Bed Mobility Bed Mobility: Supine to Sit;Sit to Supine Supine to Sit: 5: Supervision Sit to Supine: 5: Supervision Details for Bed Mobility Assistance: cues for saftey awareness Transfers Transfers: Sit to Stand;Stand to Sit Sit to Stand: 5: Supervision;From chair/3-in-1;From bed;With armrests;With upper extremity assist Stand to Sit: 5: Supervision;To bed;With armrests;With upper extremity assist Details for Transfer Assistance: cues for hand placement Ambulation/Gait Ambulation/Gait Assistance: 4: Min guard;5: Supervision Ambulation Distance (Feet): 150 Feet (and 20) Assistive device: Rolling walker Ambulation/Gait Assistance Details: cues for pace, posture, and position from RW Gait Pattern: Step-to pattern Stairs: Yes Stairs Assistance: 4: Min guard Stair Management Technique: Two rails;Forwards;Step to pattern Number of Stairs: 5 Wheelchair Mobility Wheelchair Mobility: No    Exercises     PT Diagnosis:    PT Problem List:   PT Treatment Interventions:     PT Goals Acute Rehab PT Goals PT Goal Formulation: With patient Time For Goal Achievement:  01/21/13 Potential to Achieve Goals: Good Pt will go Supine/Side to Sit: with supervision PT Goal: Supine/Side to Sit - Progress: Met Pt will go Sit to Supine/Side: with supervision PT Goal: Sit to Supine/Side - Progress: Met Pt will go Sit to Stand: with supervision PT Goal: Sit to Stand - Progress: Met Pt will go Stand to Sit: with supervision PT Goal: Stand to Sit - Progress: Met Pt will Ambulate: 51 - 150 feet;with supervision;with rolling walker PT Goal: Ambulate - Progress: Met Pt will Go Up / Down Stairs: 6-9 stairs;with min assist;with least restrictive assistive device PT Goal: Up/Down Stairs - Progress: Met  Visit Information  Last PT Received On: 01/16/13 Assistance Needed: +1    Subjective Data  Patient Stated Goal: Resume previous lifestyle with decreased pain   Cognition  Cognition Arousal/Alertness: Awake/alert Behavior During Therapy: WFL for tasks assessed/performed Overall Cognitive Status: Within Functional Limits for tasks assessed    Balance     End of Session PT - End of Session Equipment Utilized During Treatment: Gait belt Activity Tolerance: Patient tolerated treatment well Patient left: in bed;with call bell/phone within reach;with family/visitor present Nurse Communication: Mobility status   GP     Amanpreet Delmont 01/16/2013, 1:53 PM

## 2013-01-16 NOTE — Evaluation (Signed)
Occupational Therapy Evaluation Patient Details Name: Sharon Fitzgerald MRN: [id number] DOB: [birth year] Today's Date: 01/16/2013 Time: 4098-1191 OT Time Calculation (min): 25 min  OT Assessment / Plan / Recommendation Clinical Impression  This 72 year old female was admitted for R TKA. Will follow in acute for safety during toilet transfers.  Pt will have 24/7 at home iniitiallly and does not want 3:1 commode    OT Assessment  Patient needs continued OT Services    Follow Up Recommendations  No OT follow up    Barriers to Discharge      Equipment Recommendations  None recommended by OT    Recommendations for Other Services    Frequency  Min 2X/week    Precautions / Restrictions Precautions Precautions: Knee Restrictions Weight Bearing Restrictions: No   Pertinent Vitals/Pain R knee sore    ADL  Toilet Transfer: Min guard Toilet Transfer Method: Sit to stand Toilet Transfer Equipment: Comfort height toilet;Grab bars Toileting - Clothing Manipulation and Hygiene: Supervision/safety Where Assessed - Engineer, mining and Hygiene: Sit to stand from 3-in-1 or toilet Tub/Shower Transfer: Min guard Tub/Shower Transfer Method: Science writer: Walk in shower Equipment Used: Rolling walker Transfers/Ambulation Related to ADLs: Pt has urgency due to spinal stenosis.  When in bathroom, turned quickly leaving walker behind and quicklyl sat on commode.  Cued for safety,but pt carried through with unsafe transfer. Therapist had difficulty guarding due to her positioning walker between [place].   Explained that she has urgency.  I reinforced that a fall would be a much worse outcome than urine spillage.  Discussed 3:1 for bedside due to urgency, but she doesn't want this. Pt vomited after using commode.   ADL Comments: Pt is very flexible:  set up/occasional min A for LB adls.      OT Diagnosis: Generalized weakness  OT Problem List: Decreased safety  awareness;Decreased strength OT Treatment Interventions: Self-care/ADL training;Patient/family education;DME and/or AE instruction   OT Goals Acute Rehab OT Goals OT Goal Formulation: With patient Time For Goal Achievement: 01/19/13 Potential to Achieve Goals: Good ADL Goals Pt Will Transfer to Toilet: Regular height toilet;with modified independence;Ambulation (sink next to commode, no cues for safety) ADL Goal: Toilet Transfer - Progress: Goal set today  Visit Information  Last OT Received On: 01/16/13 Assistance Needed: +1    Subjective Data  Subjective: My toilet is regular but I won't have a problem.  I have a high one in the hall Patient Stated Goal: home, get back to being independent   Prior Functioning     Home Living Lives With: Alone Available Help at Discharge: Friend(s) (coming to stay, 24/7) Bathroom Shower/Tub: Walk-in Stage manager: Pharmacist, community:  (handicapped in hall) Prior Function Level of Independence: Independent Communication Communication: No difficulties         Vision/Perception     Copywriter, advertising Arousal/Alertness: Awake/alert Behavior During Therapy: WFL for tasks assessed/performed Overall Cognitive Status:  (decreased safety)    Extremity/Trunk Assessment Right Upper Extremity Assessment RUE ROM/Strength/Tone: WFL for tasks assessed Left Upper Extremity Assessment LUE ROM/Strength/Tone: WFL for tasks assessed     Mobility Bed Mobility Bed Mobility: Sit to Supine Sit to Supine: 4: Min assist Details for Bed Mobility Assistance: flat bed, no cues needed Transfers Transfers: Sit to Stand;Stand to Sit Sit to Stand: 4: Min guard;From bed;From chair/3-in-1;With upper extremity assist Stand to Sit: 4: Min guard;With upper extremity assist;To chair/3-in-1;To bed Details for Transfer Assistance: cues for hand placement  Exercise     Balance     End of Session OT - End of Session Activity  Tolerance: Patient tolerated treatment well Patient left: in bed;with call bell/phone within reach Nurse Communication:  (pt vomited after using commode)  GO     Coltrane Tugwell 01/16/2013, 10:51 AM Marica Otter, OTR/L [phone] 01/16/2013

## 2013-01-16 NOTE — Care Management Note (Addendum)
    Page 1 of 2   01/16/2013     3:05:11 PM   CARE MANAGEMENT NOTE 01/16/2013  Patient:  Sharon Fitzgerald, Sharon Fitzgerald   Account Number:  [id number]  Date Initiated:  01/16/2013  Documentation initiated by:  Colleen Can  Subjective/Objective Assessment:   DX RT KINEE OSTEOARTHRITIS; TOTAL KNEE REPLACEMENT     Action/Plan:   CM SPOKE WITH PATIENT. PLANS ARE FOR PATIENT TO RETURN TO HER HOME IN [place] WHERE FRIEND WILL BE CAREGIVER. [facility] WILL PROVIDE HH SERVICES.   Anticipated DC Date:  01/16/2013   Anticipated DC Plan:  HOME W HOME HEALTH SERVICES      DC Planning Services  CM consult      [facility] Choice  HOME HEALTH  DURABLE MEDICAL EQUIPMENT   Choice offered to / List presented to:  C-1 Patient   DME arranged  [facility]      DME agency  [facility]     HH arranged  HH-2 PT      [facility] agency  [facility]   Status of service:  Completed, signed off Medicare Important Message given?   (If response is "NO", the following Medicare IM given date fields will be blank) Date Medicare IM given:   Date Additional Medicare IM given:    Discharge Disposition:  HOME W HOME HEALTH SERVICES  Per UR Regulation:    If discussed at Long Length of Stay Meetings, dates discussed:    Comments:  01/16/2013 [facility] Alyxandra Tenbrink BSN RN CCM [phone] [facility] WILL PROVIDE hh SERVICES WITH START DATE OF DAY AFTER PT IS DISCHARGED. RW HAS BEEN DELIVERED TO PT'S ROOM BY [facility] REP.  01/16/2013 Colleen Can BSN RN CCM [phone] Pt will need RW. Christoper Allegra is Neurosurgeon. Spoke with Christoper Allegra intake-[place] who requested that DME order, face sheet and H&P be faxed to [phone]. Confirmation received.

## 2013-01-16 NOTE — Progress Notes (Signed)
   Subjective: 1 Day Post-Op Procedure(s) (LRB): RIGHT TOTAL KNEE ARTHROPLASTY (Right)   Patient reports pain as mild, pain well controlled. Didn't sleep well, but no other events throughout the night. Very pleased with how she feels after having spinal anesthesia. Ready to be discharged home.  Objective:   VITALS:   Filed Vitals:   01/16/13 0535  BP: 120/72  Pulse: 63  Temp: 97.8 F (36.6 C)  Resp: 16    Neurovascular intact Dorsiflexion/Plantar flexion intact Incision: dressing C/D/I No cellulitis present Compartment soft  LABS  Recent Labs  01/16/13 0449  HGB 10.3*  HCT 32.0*  WBC 14.6*  PLT 285     Recent Labs  01/16/13 0449  NA 137  K 3.1*  BUN 17  CREATININE 1.07  GLUCOSE 131*     Assessment/Plan: 1 Day Post-Op Procedure(s) (LRB): RIGHT TOTAL KNEE ARTHROPLASTY (Right) HV drain d/c'ed Foley cath d/c'ed Advance diet Up with therapy D/C IV fluids Discharge home with home health Follow up in 2 weeks at [facility]. Follow up with OLIN,Shateka Petrea D in 2 weeks.  Contact information:  [facility] [address], [address] [place] [address] [phone]    Expected ABLA  Treated with iron and will observe  Obese (BMI 30-39.9) Estimated body mass index is 31.89 kg/(m^2) as calculated from the following:   Height as of this encounter: 5\' 3"  (1.6 m).   Weight as of this encounter: 81.647 kg (180 lb). Patient also counseled that weight may inhibit the healing process Patient counseled that losing weight will help with future health issues       Anastasio Auerbach. Rachyl Wuebker   PAC  01/16/2013, 8:46 AM

## 2013-01-21 NOTE — Discharge Summary (Signed)
Physician Discharge Summary  Patient ID: Sharon Fitzgerald MRN: [id number] DOB/AGE: 09-18-1940 72 y.o.  Admit date: 01/15/2013 Discharge date: 01/16/2013   Procedures:  Procedure(s) (LRB): RIGHT TOTAL KNEE ARTHROPLASTY (Right)  Attending Physician:  Dr. Durene Romans   Admission Diagnoses:   Bilateral knee pain, right greater than left.  Discharge Diagnoses:  Principal Problem:   S/P right TKA Active Problems:   Expected blood loss anemia   Obese  Past Medical History  Diagnosis Date  . Hypertension   . Arthritis   . Spinal stenosis     sees Dr. Shon Baton    HPI: Sharon Fitzgerald, 72 y.o. female, has a history of pain and functional disability in the right knee due to arthritis and has failed non-surgical conservative treatments for greater than 12 weeks to includeNSAID's and/or analgesics, corticosteriod injections and activity modification. Onset of symptoms was gradual, starting >10 years ago with gradually worsening course since that time. The patient noted no past surgery on the right knee(s). Patient currently rates pain in the right knee(s) at 9 out of 10 with activity. Patient has worsening of pain with activity and weight bearing, pain that interferes with activities of daily living, pain with passive range of motion, crepitus and sitting for long periods. Patient has evidence of periarticular osteophytes and joint space narrowing by imaging studies. There is no active infection. Risks, benefits and expectations were discussed with the patient. Patient understand the risks, benefits and expectations and wishes to proceed with surgery.   PCP: Elie Confer, MD   Discharged Condition: good  Hospital Course:  Patient underwent the above stated procedure on 01/15/2013. Patient tolerated the procedure well and brought to the recovery room in good condition and subsequently to the floor.  POD #1 BP: 120/72 ; Pulse: 63 ; Temp: 97.8 F (36.6 C) ; Resp: 16  Pt's foley was  removed, as well as the hemovac drain removed. IV was changed to a saline lock. Patient reports pain as mild, pain well controlled. Didn't sleep well, but no other events throughout the night. Very pleased with how she feels after having spinal anesthesia. Ready to be discharged home. Neurovascular intact, dorsiflexion/plantar flexion intact, incision: dressing C/D/I, no cellulitis present and compartment soft.   LABS  Basename  01/16/13    0449   HGB  10.3  HCT  32.0    Discharge Exam: General appearance: alert, cooperative and no distress Extremities: Homans sign is negative, no sign of DVT, no edema, redness or tenderness in the calves or thighs and no ulcers, gangrene or trophic changes  Disposition:   Home-Health Care Svc with follow up in 2 weeks   Follow-up Information   Follow up with Shelda Pal, MD. Schedule an appointment as soon as possible for a visit in 2 weeks.   Contact information:   [address] [place] [address] [phone]       Discharge Orders   Future Orders Complete By Expires     Call MD / Call 911  As directed     Comments:      If you experience chest pain or shortness of breath, CALL 911 and be transported to the hospital emergency room.  If you develope a fever above 101 F, pus (white drainage) or increased drainage or redness at the wound, or calf pain, call your surgeon's office.    Change dressing  As directed     Comments:      Maintain surgical dressing for  10-14 days, then change the dressing daily with sterile 4 x 4 inch gauze dressing and tape. Keep the area dry and clean.    Constipation Prevention  As directed     Comments:      Drink plenty of fluids.  Prune juice may be helpful.  You may use a stool softener, such as Colace (over the counter) 100 mg twice a day.  Use MiraLax (over the counter) for constipation as needed.    Diet - low sodium heart healthy  As directed     Discharge instructions  As directed      Comments:      Maintain surgical dressing for 10-14 days, then replace with gauze and tape. Keep the area dry and clean until follow up. Follow up in 2 weeks at [facility]. Call with any questions or concerns.    Driving restrictions  As directed     Comments:      No driving for 4 weeks    Increase activity slowly as tolerated  As directed     TED hose  As directed     Comments:      Use stockings (TED hose) for 2 weeks on both leg(s).  You may remove them at night for sleeping.    Weight bearing as tolerated  As directed          Medication List    STOP taking these medications       aspirin 325 MG tablet     ciprofloxacin 500 MG tablet  Commonly known as:  CIPRO      TAKE these medications       aspirin 325 MG EC tablet  Take 1 tablet (325 mg total) by mouth 2 (two) times daily.     chlorthalidone 25 MG tablet  Commonly known as:  HYGROTON  Take 25 mg by mouth daily.     DSS 100 MG Caps  Take 100 mg by mouth 2 (two) times daily.     estrogen (conjugated)-medroxyprogesterone 0.625-2.5 MG per tablet  Commonly known as:  PREMPRO  Take 1 tablet by mouth daily.     ferrous sulfate 325 (65 FE) MG tablet  Take 1 tablet (325 mg total) by mouth 3 (three) times daily after meals.     HYDROCHLOROTHIAZIDE PO  Take 1 tablet by mouth every morning.     HYDROcodone-acetaminophen 7.5-325 MG per tablet  Commonly known as:  NORCO  Take 1-2 tablets by mouth every 4 (four) hours.     polyethylene glycol packet  Commonly known as:  MIRALAX / GLYCOLAX  Take 17 g by mouth 2 (two) times daily.     tiZANidine 4 MG capsule  Commonly known as:  ZANAFLEX  Take 1 capsule (4 mg total) by mouth 3 (three) times daily as needed for muscle spasms. Muscle spasms         Signed: Anastasio Auerbach. Sharon Fitzgerald   PAC  01/21/2013, 1:54 PM

## 2013-02-09 ENCOUNTER — Ambulatory Visit: Payer: Medicare PPO | Attending: Orthopedic Surgery | Admitting: Physical Therapy

## 2013-02-09 DIAGNOSIS — M25569 Pain in unspecified knee: Secondary | ICD-10-CM | POA: Insufficient documentation

## 2013-02-09 DIAGNOSIS — IMO0001 Reserved for inherently not codable concepts without codable children: Secondary | ICD-10-CM | POA: Insufficient documentation

## 2013-02-09 DIAGNOSIS — R5381 Other malaise: Secondary | ICD-10-CM | POA: Insufficient documentation

## 2013-02-09 DIAGNOSIS — R269 Unspecified abnormalities of gait and mobility: Secondary | ICD-10-CM | POA: Insufficient documentation

## 2013-02-09 DIAGNOSIS — Z96659 Presence of unspecified artificial knee joint: Secondary | ICD-10-CM | POA: Insufficient documentation

## 2013-02-11 ENCOUNTER — Ambulatory Visit: Payer: Medicare PPO | Admitting: Physical Therapy

## 2013-02-12 ENCOUNTER — Ambulatory Visit: Payer: Medicare PPO | Admitting: Physical Therapy

## 2013-02-16 ENCOUNTER — Ambulatory Visit: Payer: Medicare PPO | Admitting: Physical Therapy

## 2013-02-18 ENCOUNTER — Ambulatory Visit: Payer: Medicare PPO | Admitting: Physical Therapy

## 2013-02-19 ENCOUNTER — Ambulatory Visit: Payer: Medicare PPO | Admitting: Physical Therapy

## 2013-02-23 ENCOUNTER — Ambulatory Visit: Payer: Medicare PPO | Admitting: Physical Therapy

## 2013-02-25 ENCOUNTER — Ambulatory Visit: Payer: Medicare PPO | Admitting: Physical Therapy

## 2013-02-27 ENCOUNTER — Ambulatory Visit: Payer: Medicare PPO | Admitting: Physical Therapy

## 2013-03-02 ENCOUNTER — Ambulatory Visit: Payer: Medicare PPO | Admitting: Physical Therapy

## 2013-03-04 ENCOUNTER — Ambulatory Visit: Payer: Medicare PPO | Admitting: Physical Therapy

## 2013-03-06 ENCOUNTER — Ambulatory Visit: Payer: Medicare PPO | Attending: Orthopedic Surgery | Admitting: Physical Therapy

## 2013-03-06 DIAGNOSIS — R5381 Other malaise: Secondary | ICD-10-CM | POA: Insufficient documentation

## 2013-03-06 DIAGNOSIS — IMO0001 Reserved for inherently not codable concepts without codable children: Secondary | ICD-10-CM | POA: Insufficient documentation

## 2013-03-06 DIAGNOSIS — R269 Unspecified abnormalities of gait and mobility: Secondary | ICD-10-CM | POA: Insufficient documentation

## 2013-03-06 DIAGNOSIS — M25569 Pain in unspecified knee: Secondary | ICD-10-CM | POA: Insufficient documentation

## 2013-03-06 DIAGNOSIS — Z96659 Presence of unspecified artificial knee joint: Secondary | ICD-10-CM | POA: Insufficient documentation

## 2013-03-09 ENCOUNTER — Ambulatory Visit: Payer: Medicare PPO | Admitting: Physical Therapy

## 2013-03-11 ENCOUNTER — Ambulatory Visit: Payer: Medicare PPO | Admitting: Physical Therapy

## 2013-03-16 ENCOUNTER — Ambulatory Visit: Payer: Medicare PPO | Admitting: Physical Therapy

## 2013-03-18 ENCOUNTER — Ambulatory Visit: Payer: Medicare PPO | Admitting: Physical Therapy

## 2013-03-24 ENCOUNTER — Ambulatory Visit: Payer: Medicare PPO | Admitting: Physical Therapy

## 2013-03-26 ENCOUNTER — Ambulatory Visit: Payer: Medicare PPO | Admitting: Physical Therapy

## 2013-03-30 ENCOUNTER — Ambulatory Visit: Payer: Medicare PPO | Admitting: Physical Therapy

## 2013-04-01 ENCOUNTER — Ambulatory Visit: Payer: Medicare PPO | Admitting: Physical Therapy

## 2013-04-20 NOTE — H&P (Signed)
History of Present Illness The patient is a 72 year old female who presents today for follow up of their back. The patient is being followed for their central back pain. Symptoms reported today include: pain and weakness (right lower ext. ), while the patient does not report symptoms of: numbness or urinary incontinence. The patient states that they are doing poorly (had TKA holding back progress due to back). Current treatment includes: activity modification. The following medication has been used for pain control: Norco (takes 3 a day). The patient reports their current pain level to be moderate to severe.    Subjective Transcription  She presents today for follow up concerning her back. The patient recently had her total knee done on the right side and she is improving. She still requires the left one done but her back pain has gotten significantly worse. 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 I last saw her in May of 2014. 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    Allergies No Known Drug Allergies. 04/11/2012   Social History Alcohol use. current drinker; drinks wine; only occasionally per week Children. 2 Current work status. retired Financial planner (Currently). no Exercise. Exercises weekly; does other and gym / weights Illicit drug use. no Living situation. live alone Marital status. divorced Most recent primary occupation. 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 greater than 5 Pain Contract. no Previously in rehab. no Tobacco / smoke exposure. no Tobacco use. never smoker   Medication History Norco (5-325MG  Tablet, 1-2 Tablet Oral po q 6hrs prn pain, Taken starting 03/24/2013) Active. Amoxicillin (500MG  Tablet, 4 tablet(s) Oral po 1 hr prior to dental  procedure, Taken starting 03/18/2013) Active. Chlorthalidone (25MG  Tablet, Oral) Active. Bayer Aspirin ( Oral) Specific dose unknown - Active. Aspirin 325mg  Active. Colace Active. Miralax Active. Iron Active. Vitamin C ( Oral) Active. Vitamin D ( Oral) Specific dose unknown - Active. Centrum ( Oral) Active. Prempro ( Oral) Specific dose unknown - Active.   Objective Transcription  Clinically she remains neurologically intact. She has a well healed surgical scar over the knee. No previous surgical incision on her lumbar spine. She has pain with extension of the spine, relief with forward flexion, intact peripheral pulses. Compartments soft, nontender. She has chronic incontinence of urine, no incontinence of bowel. No shortness of breath or chest pain.    RADIOGRAPHS:  X-rays today, AP, lateral flexion and extension lumbar spine, demonstrate dynamic instability at L4-5 with static instability at L5-S1. She does have an extra lumbar vertebra which I am referring to as S1. On her MRI she has spinal stenosis at L4-5 and L5-S1. There is a minimal disc bulge at L3-4 with slight left central compression due to the facet arthrosis.      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 l Risks of surgery include, but are not limited to: Death, stroke, paralysis, nerve root damage/injury, bleeding, blood clots, loss of bowel/bladder control, sexual dysfunction, retrograde ejaculation, hardware failure, or malposition, spinal fluid leak, adjacent segment disease, non-union, need for further surgery, ongoing or worse pain, injury  to bladder, bowel and abdominal  contents, infection and recurrent disc herniation  l We have gone over the risks and benefits of surgery, which include infection, bleeding, nerve damage, death, stroke, paralysis, failure to heal, need for further surgery, ongoing or worse pain, loss of fixation, need for further surgery, CSF leak, loss of bowel or bladder control, ongoing or worse pain.    Plans Transcription(Jazzlin Clements Sheela Stack, MD; 04/01/2013 5:04 PM)  At this point in time we have had a long discussion about a lumbar decompression for her stenosis. I am concerned that with the decompression the spondylolisthesis can worsen especially at the L4-5 level. Therefore I have recommended once the decompression is complete an instrumented fusion. Her DEXA scan she indicates demonstrated osteopenia and if she has good bone quality I would do an instrumented fusion. If not I would just do an uninstrumented in situ fusion and brace after surgery. I have explained the risks to the patient and to her daughter which includes infection, bleeding, nerve damage, death, stroke, paralysis, failure to heal, need for further surgery, ongoing or worse pain, loss of bowel and bladder control, hardware complications, nonunion, adjacent segment disease. I would also since there is some disease at L3-5 also extend that probe up to the 3-4 level and if I felt it was significant extend the decompression to include the L3-4 level. We will get preoperative medical clearance and I will also have her primary care physician send me the DEXA scan results. She would like to do this in mid October which I think is fine so we will talk again in mid to late September prior to surgery. Because this is a multilevel fusion procedure I would also use a bone stimulator.

## 2013-04-23 ENCOUNTER — Encounter (HOSPITAL_COMMUNITY): Payer: Self-pay | Admitting: Pharmacy Technician

## 2013-04-24 ENCOUNTER — Encounter (HOSPITAL_COMMUNITY): Payer: Self-pay

## 2013-04-24 ENCOUNTER — Other Ambulatory Visit (HOSPITAL_COMMUNITY): Payer: Self-pay | Admitting: *Deleted

## 2013-04-24 ENCOUNTER — Encounter (HOSPITAL_COMMUNITY)
Admission: RE | Admit: 2013-04-24 | Discharge: 2013-04-24 | Disposition: A | Discharge status: Home / Self Care | Payer: Medicare PPO | Source: Ambulatory Visit | Attending: Orthopedic Surgery | Admitting: Orthopedic Surgery

## 2013-04-24 DIAGNOSIS — Z01812 Encounter for preprocedural laboratory examination: Secondary | ICD-10-CM | POA: Insufficient documentation

## 2013-04-24 DIAGNOSIS — Z01818 Encounter for other preprocedural examination: Secondary | ICD-10-CM | POA: Insufficient documentation

## 2013-04-24 LAB — CBC
Hemoglobin: 12.1 g/dL (ref 12.0–15.0)
MCV: 91.1 fL (ref 78.0–100.0)
Platelets: 335 10*3/uL (ref 150–400)
RBC: 4.14 MIL/uL (ref 3.87–5.11)
WBC: 9.5 10*3/uL (ref 4.0–10.5)

## 2013-04-24 LAB — BASIC METABOLIC PANEL
CO2: 26 mEq/L (ref 19–32)
Calcium: 9.7 mg/dL (ref 8.4–10.5)
Chloride: 101 mEq/L (ref 96–112)
Potassium: 3.9 mEq/L (ref 3.5–5.1)
Sodium: 138 mEq/L (ref 135–145)

## 2013-04-24 LAB — TYPE AND SCREEN
ABO/RH(D): A POS
Antibody Screen: NEGATIVE

## 2013-04-24 NOTE — Progress Notes (Signed)
Patient primary physician - Dr. Hyman Hopes with [facility] physicians Cardiologist - dr. Katrinka Blazing - ekg June 2014 will request records

## 2013-04-24 NOTE — Pre-Procedure Instructions (Signed)
Sharon Fitzgerald  04/24/2013   Your procedure is scheduled on:  April 30, 2013 at 7:30 AM  Report to [facility] at 5:30 AM.  Call this number if you have problems the morning of surgery: [phone]   Remember:   Do not eat food or drink liquids after midnight.   Take these medicines the morning of surgery with A SIP OF WATER: HYDROcodone-acetaminophen (NORCO/VICODIN) - if needed  Stop all Vitamins, Herbal medications, Aspirin and NSAIDS (Ibuprofen, Motrin, Aleve, Naproxen, etc.) as of today, 04/24/13.   Do not wear jewelry, make-up or nail polish.  Do not wear lotions, powders, or perfumes. You may wear deodorant.  Do not shave 48 hours prior to surgery.   Do not bring valuables to the hospital.  [facility] is not responsible                   for any belongings or valuables.  Contacts, dentures or bridgework may not be worn into surgery.  Leave suitcase in the car. After surgery it may be brought to your room.  For patients admitted to the hospital, checkout time is 11:00 AM the day of  discharge.     Special Instructions: Shower using CHG 2 nights before surgery and the night before surgery.  If you shower the day of surgery use CHG.  Use special wash - you have one bottle of CHG for all showers.  You should use approximately 1/3 of the bottle for each shower.   Please read over the following fact sheets that you were given: Pain Booklet, Coughing and Deep Breathing, Blood Transfusion Information, MRSA Information and Surgical Site Infection Prevention

## 2013-04-29 MED ORDER — ACETAMINOPHEN 10 MG/ML IV SOLN
1000.0000 mg | Freq: Four times a day (QID) | INTRAVENOUS | Status: DC
  Administered 2013-04-30: 1000 mg via INTRAVENOUS

## 2013-04-29 MED ORDER — DEXAMETHASONE SODIUM PHOSPHATE 4 MG/ML IJ SOLN
4.0000 mg | Freq: Once | INTRAMUSCULAR | Status: AC
  Administered 2013-04-30: 4 mg via INTRAVENOUS
  Filled 2013-04-29: qty 1

## 2013-04-29 MED ORDER — CEFAZOLIN SODIUM-DEXTROSE 2-3 GM-% IV SOLR
2.0000 g | INTRAVENOUS | Status: AC
  Administered 2013-04-30: 2 g via INTRAVENOUS
  Filled 2013-04-29: qty 50

## 2013-04-30 ENCOUNTER — Encounter (HOSPITAL_COMMUNITY): Payer: Self-pay | Admitting: Critical Care Medicine

## 2013-04-30 ENCOUNTER — Inpatient Hospital Stay (HOSPITAL_COMMUNITY): Payer: Medicare PPO | Admitting: Critical Care Medicine

## 2013-04-30 ENCOUNTER — Inpatient Hospital Stay (HOSPITAL_COMMUNITY): Payer: Medicare PPO

## 2013-04-30 ENCOUNTER — Inpatient Hospital Stay (HOSPITAL_COMMUNITY)
Admission: RE | Admit: 2013-04-30 | Discharge: 2013-05-02 | DRG: 460 | Disposition: A | Discharge status: Home Health | Payer: Medicare PPO | Source: Ambulatory Visit | Attending: Orthopedic Surgery | Admitting: Orthopedic Surgery

## 2013-04-30 ENCOUNTER — Encounter (HOSPITAL_COMMUNITY)
Admission: RE | Disposition: A | Discharge status: Home Health | Payer: Self-pay | Source: Ambulatory Visit | Attending: Orthopedic Surgery

## 2013-04-30 DIAGNOSIS — Z7982 Long term (current) use of aspirin: Secondary | ICD-10-CM

## 2013-04-30 DIAGNOSIS — Z6831 Body mass index (BMI) 31.0-31.9, adult: Secondary | ICD-10-CM

## 2013-04-30 DIAGNOSIS — M4310 Spondylolisthesis, site unspecified: Principal | ICD-10-CM | POA: Diagnosis present

## 2013-04-30 DIAGNOSIS — I10 Essential (primary) hypertension: Secondary | ICD-10-CM | POA: Diagnosis present

## 2013-04-30 DIAGNOSIS — E669 Obesity, unspecified: Secondary | ICD-10-CM | POA: Diagnosis present

## 2013-04-30 DIAGNOSIS — Z981 Arthrodesis status: Secondary | ICD-10-CM

## 2013-04-30 DIAGNOSIS — M5137 Other intervertebral disc degeneration, lumbosacral region: Secondary | ICD-10-CM | POA: Diagnosis present

## 2013-04-30 DIAGNOSIS — M51379 Other intervertebral disc degeneration, lumbosacral region without mention of lumbar back pain or lower extremity pain: Secondary | ICD-10-CM | POA: Diagnosis present

## 2013-04-30 DIAGNOSIS — Z79899 Other long term (current) drug therapy: Secondary | ICD-10-CM

## 2013-04-30 DIAGNOSIS — Z96659 Presence of unspecified artificial knee joint: Secondary | ICD-10-CM

## 2013-04-30 HISTORY — PX: LUMBAR LAMINECTOMY/DECOMPRESSION MICRODISCECTOMY: SHX5026

## 2013-04-30 SURGERY — LUMBAR LAMINECTOMY/DECOMPRESSION MICRODISCECTOMY 3 LEVELS
Anesthesia: General | Site: Spine Lumbar | Wound class: Clean

## 2013-04-30 MED ORDER — CEFAZOLIN SODIUM 1-5 GM-% IV SOLN
1.0000 g | Freq: Three times a day (TID) | INTRAVENOUS | Status: AC
  Administered 2013-04-30 – 2013-05-01 (×2): 1 g via INTRAVENOUS
  Filled 2013-04-30 (×2): qty 50

## 2013-04-30 MED ORDER — OXYCODONE HCL 5 MG PO TABS
10.0000 mg | ORAL_TABLET | ORAL | Status: DC | PRN
  Administered 2013-05-01 – 2013-05-02 (×4): 10 mg via ORAL
  Filled 2013-04-30 (×4): qty 2

## 2013-04-30 MED ORDER — PHENYLEPHRINE HCL 10 MG/ML IJ SOLN
INTRAMUSCULAR | Status: DC | PRN
  Administered 2013-04-30: 80 ug via INTRAVENOUS

## 2013-04-30 MED ORDER — METHOCARBAMOL 500 MG PO TABS
ORAL_TABLET | ORAL | Status: AC
  Filled 2013-04-30: qty 1

## 2013-04-30 MED ORDER — DEXAMETHASONE SODIUM PHOSPHATE 4 MG/ML IJ SOLN
4.0000 mg | Freq: Four times a day (QID) | INTRAMUSCULAR | Status: DC
  Filled 2013-04-30 (×11): qty 1

## 2013-04-30 MED ORDER — DOCUSATE SODIUM 100 MG PO CAPS
100.0000 mg | ORAL_CAPSULE | Freq: Two times a day (BID) | ORAL | Status: DC
  Administered 2013-04-30 – 2013-05-02 (×4): 100 mg via ORAL
  Filled 2013-04-30 (×6): qty 1

## 2013-04-30 MED ORDER — ZOLPIDEM TARTRATE 5 MG PO TABS: 5.0000 mg | ORAL_TABLET | Freq: Every evening | ORAL | Status: DC | PRN

## 2013-04-30 MED ORDER — HYDROMORPHONE HCL PF 1 MG/ML IJ SOLN
INTRAMUSCULAR | Status: AC
  Filled 2013-04-30: qty 1

## 2013-04-30 MED ORDER — MENTHOL 3 MG MT LOZG: 1.0000 | LOZENGE | OROMUCOSAL | Status: DC | PRN

## 2013-04-30 MED ORDER — MORPHINE SULFATE 2 MG/ML IJ SOLN: 1.0000 mg | INTRAMUSCULAR | Status: DC | PRN

## 2013-04-30 MED ORDER — ACETAMINOPHEN 10 MG/ML IV SOLN
1000.0000 mg | Freq: Four times a day (QID) | INTRAVENOUS | Status: AC
  Administered 2013-05-01 (×3): 1000 mg via INTRAVENOUS
  Filled 2013-04-30 (×4): qty 100

## 2013-04-30 MED ORDER — LIDOCAINE HCL (CARDIAC) 20 MG/ML IV SOLN
INTRAVENOUS | Status: DC | PRN
  Administered 2013-04-30: 100 mg via INTRAVENOUS

## 2013-04-30 MED ORDER — OXYCODONE HCL 5 MG/5ML PO SOLN: 5.0000 mg | Freq: Once | ORAL | Status: AC | PRN

## 2013-04-30 MED ORDER — SODIUM CHLORIDE 0.9 % IV SOLN: 250.0000 mL | INTRAVENOUS | Status: DC

## 2013-04-30 MED ORDER — MORPHINE SULFATE (PF) 1 MG/ML IV SOLN
INTRAVENOUS | Status: DC
  Administered 2013-04-30: 20:00:00 via INTRAVENOUS
  Administered 2013-05-01: 4 mg via INTRAVENOUS
  Administered 2013-05-01: 5 mg via INTRAVENOUS
  Administered 2013-05-01: 4 mg via INTRAVENOUS
  Filled 2013-04-30: qty 25

## 2013-04-30 MED ORDER — PROPOFOL 10 MG/ML IV BOLUS
INTRAVENOUS | Status: DC | PRN
  Administered 2013-04-30 (×2): 20 mg via INTRAVENOUS
  Administered 2013-04-30: 140 mg via INTRAVENOUS

## 2013-04-30 MED ORDER — DIPHENHYDRAMINE HCL 50 MG/ML IJ SOLN: 12.5000 mg | Freq: Four times a day (QID) | INTRAMUSCULAR | Status: DC | PRN

## 2013-04-30 MED ORDER — VANCOMYCIN HCL 500 MG IV SOLR
INTRAVENOUS | Status: DC | PRN
  Administered 2013-04-30: 500 mg

## 2013-04-30 MED ORDER — ACETAMINOPHEN 10 MG/ML IV SOLN
INTRAVENOUS | Status: AC
  Filled 2013-04-30: qty 100

## 2013-04-30 MED ORDER — LACTATED RINGERS IV SOLN
INTRAVENOUS | Status: DC | PRN
  Administered 2013-04-30 (×4): via INTRAVENOUS

## 2013-04-30 MED ORDER — THROMBIN 20000 UNITS EX SOLR
CUTANEOUS | Status: DC | PRN
  Administered 2013-04-30: 09:00:00 via TOPICAL

## 2013-04-30 MED ORDER — PHENOL 1.4 % MT LIQD: 1.0000 | OROMUCOSAL | Status: DC | PRN

## 2013-04-30 MED ORDER — INFLUENZA VAC SPLIT QUAD 0.5 ML IM SUSP
0.5000 mL | INTRAMUSCULAR | Status: DC
  Filled 2013-04-30: qty 0.5

## 2013-04-30 MED ORDER — LISINOPRIL-HYDROCHLOROTHIAZIDE 10-12.5 MG PO TABS: 1.0000 | ORAL_TABLET | Freq: Every day | ORAL | Status: DC

## 2013-04-30 MED ORDER — DIPHENHYDRAMINE HCL 12.5 MG/5ML PO ELIX: 12.5000 mg | ORAL_SOLUTION | Freq: Four times a day (QID) | ORAL | Status: DC | PRN

## 2013-04-30 MED ORDER — DEXAMETHASONE 4 MG PO TABS
4.0000 mg | ORAL_TABLET | Freq: Four times a day (QID) | ORAL | Status: DC
  Administered 2013-05-01 – 2013-05-02 (×6): 4 mg via ORAL
  Filled 2013-04-30 (×12): qty 1

## 2013-04-30 MED ORDER — LISINOPRIL 10 MG PO TABS
10.0000 mg | ORAL_TABLET | Freq: Every day | ORAL | Status: DC
  Administered 2013-04-30: 10 mg via ORAL
  Filled 2013-04-30 (×3): qty 1

## 2013-04-30 MED ORDER — ONDANSETRON HCL 4 MG/2ML IJ SOLN: 4.0000 mg | Freq: Four times a day (QID) | INTRAMUSCULAR | Status: DC | PRN

## 2013-04-30 MED ORDER — HYDROCHLOROTHIAZIDE 12.5 MG PO CAPS
12.5000 mg | ORAL_CAPSULE | Freq: Every day | ORAL | Status: DC
  Administered 2013-04-30: 12.5 mg via ORAL
  Filled 2013-04-30 (×3): qty 1

## 2013-04-30 MED ORDER — OXYCODONE HCL 5 MG PO TABS
ORAL_TABLET | ORAL | Status: AC
  Filled 2013-04-30: qty 1

## 2013-04-30 MED ORDER — MIDAZOLAM HCL 5 MG/5ML IJ SOLN
INTRAMUSCULAR | Status: DC | PRN
  Administered 2013-04-30: 2 mg via INTRAVENOUS

## 2013-04-30 MED ORDER — ONDANSETRON HCL 4 MG/2ML IJ SOLN
INTRAMUSCULAR | Status: DC | PRN
  Administered 2013-04-30: 4 mg via INTRAVENOUS

## 2013-04-30 MED ORDER — LACTATED RINGERS IV SOLN
INTRAVENOUS | Status: DC
  Administered 2013-05-01: 04:00:00 via INTRAVENOUS

## 2013-04-30 MED ORDER — FLEET ENEMA 7-19 GM/118ML RE ENEM: 1.0000 | ENEMA | Freq: Once | RECTAL | Status: AC | PRN

## 2013-04-30 MED ORDER — VANCOMYCIN HCL 500 MG IV SOLR
INTRAVENOUS | Status: AC
  Filled 2013-04-30: qty 500

## 2013-04-30 MED ORDER — SUCCINYLCHOLINE CHLORIDE 20 MG/ML IJ SOLN
INTRAMUSCULAR | Status: DC | PRN
  Administered 2013-04-30: 100 mg via INTRAVENOUS

## 2013-04-30 MED ORDER — 0.9 % SODIUM CHLORIDE (POUR BTL) OPTIME
TOPICAL | Status: DC | PRN
  Administered 2013-04-30: 1000 mL

## 2013-04-30 MED ORDER — BUPIVACAINE-EPINEPHRINE PF 0.25-1:200000 % IJ SOLN
INTRAMUSCULAR | Status: AC
  Filled 2013-04-30: qty 30

## 2013-04-30 MED ORDER — METHOCARBAMOL 500 MG PO TABS
500.0000 mg | ORAL_TABLET | Freq: Four times a day (QID) | ORAL | Status: DC | PRN
  Administered 2013-04-30 – 2013-05-01 (×2): 500 mg via ORAL
  Filled 2013-04-30 (×2): qty 1

## 2013-04-30 MED ORDER — HYDROMORPHONE HCL PF 1 MG/ML IJ SOLN
INTRAMUSCULAR | Status: AC
  Administered 2013-04-30: 0.25 mg via INTRAVENOUS
  Filled 2013-04-30: qty 1

## 2013-04-30 MED ORDER — BUPIVACAINE-EPINEPHRINE 0.25% -1:200000 IJ SOLN
INTRAMUSCULAR | Status: DC | PRN
  Administered 2013-04-30: 10 mL

## 2013-04-30 MED ORDER — HYDROMORPHONE HCL PF 1 MG/ML IJ SOLN
0.2500 mg | INTRAMUSCULAR | Status: DC | PRN
  Administered 2013-04-30: 0.25 mg via INTRAVENOUS
  Administered 2013-04-30: 0.5 mg via INTRAVENOUS

## 2013-04-30 MED ORDER — ARTIFICIAL TEARS OP OINT
TOPICAL_OINTMENT | OPHTHALMIC | Status: DC | PRN
  Administered 2013-04-30: 1 via OPHTHALMIC

## 2013-04-30 MED ORDER — SODIUM CHLORIDE 0.9 % IJ SOLN: 9.0000 mL | INTRAMUSCULAR | Status: DC | PRN

## 2013-04-30 MED ORDER — MIDAZOLAM HCL 2 MG/2ML IJ SOLN: 1.0000 mg | INTRAMUSCULAR | Status: DC | PRN

## 2013-04-30 MED ORDER — SODIUM CHLORIDE 0.9 % IJ SOLN: 3.0000 mL | INTRAMUSCULAR | Status: DC | PRN

## 2013-04-30 MED ORDER — NALOXONE HCL 0.4 MG/ML IJ SOLN: 0.4000 mg | INTRAMUSCULAR | Status: DC | PRN

## 2013-04-30 MED ORDER — ONDANSETRON HCL 4 MG/2ML IJ SOLN: 4.0000 mg | INTRAMUSCULAR | Status: DC | PRN

## 2013-04-30 MED ORDER — HEMOSTATIC AGENTS (NO CHARGE) OPTIME
TOPICAL | Status: DC | PRN
  Administered 2013-04-30: 1 via TOPICAL

## 2013-04-30 MED ORDER — FENTANYL CITRATE 0.05 MG/ML IJ SOLN: 50.0000 ug | Freq: Once | INTRAMUSCULAR | Status: DC

## 2013-04-30 MED ORDER — METHOCARBAMOL 100 MG/ML IJ SOLN
500.0000 mg | Freq: Four times a day (QID) | INTRAVENOUS | Status: DC | PRN
  Filled 2013-04-30: qty 5

## 2013-04-30 MED ORDER — PROPOFOL INFUSION 10 MG/ML OPTIME
INTRAVENOUS | Status: DC | PRN
  Administered 2013-04-30: 50 ug/kg/min via INTRAVENOUS

## 2013-04-30 MED ORDER — SODIUM CHLORIDE 0.9 % IJ SOLN
3.0000 mL | Freq: Two times a day (BID) | INTRAMUSCULAR | Status: DC
  Administered 2013-05-01: 3 mL via INTRAVENOUS

## 2013-04-30 MED ORDER — FENTANYL CITRATE 0.05 MG/ML IJ SOLN
INTRAMUSCULAR | Status: DC | PRN
  Administered 2013-04-30: 100 ug via INTRAVENOUS
  Administered 2013-04-30: 50 ug via INTRAVENOUS
  Administered 2013-04-30: 100 ug via INTRAVENOUS
  Administered 2013-04-30 (×5): 50 ug via INTRAVENOUS
  Administered 2013-04-30: 25 ug via INTRAVENOUS
  Administered 2013-04-30 (×2): 50 ug via INTRAVENOUS
  Administered 2013-04-30: 25 ug via INTRAVENOUS

## 2013-04-30 MED ORDER — THROMBIN 20000 UNITS EX SOLR
CUTANEOUS | Status: AC
  Filled 2013-04-30: qty 20000

## 2013-04-30 MED ORDER — OXYCODONE HCL 5 MG PO TABS
5.0000 mg | ORAL_TABLET | Freq: Once | ORAL | Status: AC | PRN
  Administered 2013-04-30: 5 mg via ORAL

## 2013-04-30 SURGICAL SUPPLY — 73 items
BUR EGG ELITE 4.0 (BURR) ×2 IMPLANT
BUR MATCHSTICK NEURO 3.0 LAGG (BURR) IMPLANT
CANISTER SUCTION 2500CC (MISCELLANEOUS) ×2 IMPLANT
CLOTH BEACON ORANGE TIMEOUT ST (SAFETY) ×2 IMPLANT
CLSR STERI-STRIP ANTIMIC 1/2X4 (GAUZE/BANDAGES/DRESSINGS) ×2 IMPLANT
CONNECTOR EXPEDIUM SFX SZ A6 (Orthopedic Implant) ×2 IMPLANT
CORDS BIPOLAR (ELECTRODE) ×2 IMPLANT
COVER SURGICAL LIGHT HANDLE (MISCELLANEOUS) ×2 IMPLANT
DERMABOND ADVANCED (GAUZE/BANDAGES/DRESSINGS)
DERMABOND ADVANCED .7 DNX12 (GAUZE/BANDAGES/DRESSINGS) IMPLANT
DRAIN CHANNEL 15F RND FF W/TCR (WOUND CARE) ×2 IMPLANT
DRAPE C-ARM 42X72 X-RAY (DRAPES) ×6 IMPLANT
DRAPE ORTHO SPLIT 77X108 STRL (DRAPES) ×1
DRAPE POUCH INSTRU U-SHP 10X18 (DRAPES) ×2 IMPLANT
DRAPE SURG 17X23 STRL (DRAPES) ×2 IMPLANT
DRAPE SURG ORHT 6 SPLT 77X108 (DRAPES) ×1 IMPLANT
DRAPE TABLE COVER HEAVY DUTY (DRAPES) ×2 IMPLANT
DRAPE U-SHAPE 47X51 STRL (DRAPES) ×2 IMPLANT
DRSG MEPILEX BORDER 4X4 (GAUZE/BANDAGES/DRESSINGS) ×2 IMPLANT
DRSG MEPILEX BORDER 4X8 (GAUZE/BANDAGES/DRESSINGS) ×2 IMPLANT
DURAPREP 26ML APPLICATOR (WOUND CARE) ×2 IMPLANT
ELECT BLADE 4.0 EZ CLEAN MEGAD (MISCELLANEOUS)
ELECT CAUTERY BLADE 6.4 (BLADE) ×2 IMPLANT
ELECT REM PT RETURN 9FT ADLT (ELECTROSURGICAL) ×2
ELECTRODE BLDE 4.0 EZ CLN MEGD (MISCELLANEOUS) IMPLANT
ELECTRODE REM PT RTRN 9FT ADLT (ELECTROSURGICAL) ×1 IMPLANT
EVACUATOR 1/8 PVC DRAIN (DRAIN) IMPLANT
EVACUATOR SILICONE 100CC (DRAIN) ×2 IMPLANT
GLOVE BIOGEL PI IND STRL 8 (GLOVE) ×1 IMPLANT
GLOVE BIOGEL PI IND STRL 8.5 (GLOVE) ×3 IMPLANT
GLOVE BIOGEL PI INDICATOR 8 (GLOVE) ×1
GLOVE BIOGEL PI INDICATOR 8.5 (GLOVE) ×3
GLOVE ECLIPSE 8.5 STRL (GLOVE) ×6 IMPLANT
GLOVE ORTHO TXT STRL SZ7.5 (GLOVE) ×2 IMPLANT
GOWN PREVENTION PLUS XXLARGE (GOWN DISPOSABLE) ×2 IMPLANT
GOWN STRL NON-REIN LRG LVL3 (GOWN DISPOSABLE) ×2 IMPLANT
GOWN STRL REIN 2XL XLG LVL4 (GOWN DISPOSABLE) ×2 IMPLANT
KIT BASIN OR (CUSTOM PROCEDURE TRAY) ×2 IMPLANT
KIT ORACLE NEUROMONITING (KITS) ×2 IMPLANT
KIT ROOM TURNOVER OR (KITS) ×2 IMPLANT
KIT STIMULAN RAPID CURE 5CC (Orthopedic Implant) ×2 IMPLANT
NEEDLE 22X1 1/2 (OR ONLY) (NEEDLE) ×2 IMPLANT
NEEDLE SPNL 18GX3.5 QUINCKE PK (NEEDLE) ×4 IMPLANT
NS IRRIG 1000ML POUR BTL (IV SOLUTION) ×4 IMPLANT
PACK LAMINECTOMY ORTHO (CUSTOM PROCEDURE TRAY) ×2 IMPLANT
PACK UNIVERSAL I (CUSTOM PROCEDURE TRAY) ×2 IMPLANT
PAD ARMBOARD 7.5X6 YLW CONV (MISCELLANEOUS) ×4 IMPLANT
PATTIES SURGICAL .5 X.5 (GAUZE/BANDAGES/DRESSINGS) IMPLANT
PATTIES SURGICAL .5 X1 (DISPOSABLE) ×6 IMPLANT
ROD EXPEDIUM PRE BENT 5.5X75 (Rod) ×4 IMPLANT
SCREW POLY EXPEDIUM 5.5 6X40MM (Screw) ×4 IMPLANT
SCREW SET EXPEDIUM 8MM (Screw) ×12 IMPLANT
SHEET CONFORM 45LX20WX7H (Bone Implant) ×2 IMPLANT
SPONGE GAUZE 4X4 12PLY (GAUZE/BANDAGES/DRESSINGS) ×2 IMPLANT
SPONGE LAP 4X18 X RAY DECT (DISPOSABLE) ×16 IMPLANT
SPONGE SURGIFOAM ABS GEL 100 (HEMOSTASIS) IMPLANT
STRIP CLOSURE SKIN 1/2X4 (GAUZE/BANDAGES/DRESSINGS) ×2 IMPLANT
SURGIFLO TRUKIT (HEMOSTASIS) ×4 IMPLANT
SUT ETHILON 3 0 PS 1 (SUTURE) ×2 IMPLANT
SUT MON AB 3-0 SH 27 (SUTURE) ×1
SUT MON AB 3-0 SH27 (SUTURE) ×1 IMPLANT
SUT VIC AB 0 CT1 27 (SUTURE) ×1
SUT VIC AB 0 CT1 27XBRD ANBCTR (SUTURE) ×1 IMPLANT
SUT VIC AB 1 CTX 36 (SUTURE) ×2
SUT VIC AB 1 CTX36XBRD ANBCTR (SUTURE) ×2 IMPLANT
SUT VIC AB 2-0 CT1 18 (SUTURE) ×2 IMPLANT
SYR BULB IRRIGATION 50ML (SYRINGE) ×2 IMPLANT
SYR CONTROL 10ML LL (SYRINGE) ×4 IMPLANT
TAPE CLOTH SURG 4X10 WHT LF (GAUZE/BANDAGES/DRESSINGS) ×2 IMPLANT
TOWEL OR 17X24 6PK STRL BLUE (TOWEL DISPOSABLE) ×2 IMPLANT
TOWEL OR 17X26 10 PK STRL BLUE (TOWEL DISPOSABLE) ×2 IMPLANT
WATER STERILE IRR 1000ML POUR (IV SOLUTION) IMPLANT
YANKAUER SUCT BULB TIP NO VENT (SUCTIONS) ×2 IMPLANT

## 2013-04-30 NOTE — Brief Op Note (Signed)
04/30/2013  1:39 PM  PATIENT:  Sharon Fitzgerald  72 y.o. female  PRE-OPERATIVE DIAGNOSIS:  SPINAL STENOSIS WITH DEGENERATIVE SLIP L4-S1  POST-OPERATIVE DIAGNOSIS:  SPINAL STENOSIS WITH DEGENERATIVE SLIP L4-S1  PROCEDURE:  Procedure(s): L3-S1 DECOMPRESSION, L4-S1 INSTRUMENTED POSTERIOR SPINAL FUSION (N/A)  SURGEON:  Surgeon(s) and Role:    * Venita Lick, MD - Primary  PHYSICIAN ASSISTANT:   ASSISTANTS: Zonia Kief   ANESTHESIA:   general  EBL:  Total I/O In: 3600 [I.V.:3600] Out: 750 [Urine:150; Blood:600]  BLOOD ADMINISTERED:none  DRAINS: JP drain in the back  LOCAL MEDICATIONS USED:  MARCAINE     SPECIMEN:  No Specimen  DISPOSITION OF SPECIMEN:  N/A  COUNTS:  YES  TOURNIQUET:  * No tourniquets in log *  DICTATION: .Other Dictation: Dictation Number [id number]  PLAN OF CARE: Admit to inpatient   PATIENT DISPOSITION:  PACU - hemodynamically stable.

## 2013-04-30 NOTE — Anesthesia Preprocedure Evaluation (Addendum)
Anesthesia Evaluation  Patient identified by MRN, date of birth, ID band Patient awake    Reviewed: Allergy & Precautions, H&P , NPO status , Patient's Chart, lab work & pertinent test results  Airway Mallampati: II TM Distance: <3 FB Neck ROM: Full    Dental  (+) Dental Advisory Given   Pulmonary  breath sounds clear to auscultation        Cardiovascular hypertension, Pt. on medications Rhythm:Regular Rate:Normal     Neuro/Psych    GI/Hepatic   Endo/Other    Renal/GU      Musculoskeletal  (+) Arthritis -,   Abdominal (+) + obese,   Peds  Hematology   Anesthesia Other Findings   Reproductive/Obstetrics                          Anesthesia Physical Anesthesia Plan  ASA: II  Anesthesia Plan: General   Post-op Pain Management:    Induction: Intravenous  Airway Management Planned: Oral ETT  Additional Equipment:   Intra-op Plan:   Post-operative Plan: Extubation in OR  Informed Consent: I have reviewed the patients History and Physical, chart, labs and discussed the procedure including the risks, benefits and alternatives for the proposed anesthesia with the patient or authorized representative who has indicated his/her understanding and acceptance.   Dental advisory given  Plan Discussed with: Surgeon and CRNA  Anesthesia Plan Comments:        Anesthesia Quick Evaluation

## 2013-04-30 NOTE — Plan of Care (Signed)
Problem: Consults Goal: Diagnosis - Spinal Surgery Thoraco/Lumbar Spine Fusion     

## 2013-04-30 NOTE — Preoperative (Signed)
Beta Blockers   Reason not to administer Beta Blockers:Not Applicable 

## 2013-04-30 NOTE — Anesthesia Postprocedure Evaluation (Signed)
Anesthesia Post Note  Patient: Sharon Fitzgerald  Procedure(s) Performed: Procedure(s) (LRB): L3-S1 DECOMPRESSION, L4-S1 INSTRUMENTED POSTERIOR SPINAL FUSION (N/A)  Anesthesia type: general  Patient location: PACU  Post pain: Pain level controlled  Post assessment: Patient's Cardiovascular Status Stable  Last Vitals:  Filed Vitals:   04/30/13 1545  BP: 134/78  Pulse: 69  Temp:   Resp: 10    Post vital signs: Reviewed and stable  Level of consciousness: sedated  Complications: No apparent anesthesia complications

## 2013-04-30 NOTE — Anesthesia Procedure Notes (Signed)
Procedure Name: Intubation Date/Time: 04/30/2013 7:32 AM Performed by: Elon Alas Pre-anesthesia Checklist: Patient identified, Timeout performed, Emergency Drugs available, Suction available and Patient being monitored Patient Re-evaluated:Patient Re-evaluated prior to inductionOxygen Delivery Method: Circle system utilized Preoxygenation: Pre-oxygenation with 100% oxygen Intubation Type: IV induction Ventilation: Mask ventilation without difficulty Laryngoscope Size: Mac and 3 Grade View: Grade II Tube type: Oral Tube size: 7.5 mm Number of attempts: 1 Airway Equipment and Method: Stylet Placement Confirmation: positive ETCO2,  breath sounds checked- equal and bilateral and ETT inserted through vocal cords under direct vision Secured at: 22 cm Tube secured with: Tape Dental Injury: Teeth and Oropharynx as per pre-operative assessment

## 2013-04-30 NOTE — Transfer of Care (Signed)
Immediate Anesthesia Transfer of Care Note  Patient: Sharon Fitzgerald  Procedure(s) Performed: Procedure(s): L3-S1 DECOMPRESSION, L4-S1 INSTRUMENTED POSTERIOR SPINAL FUSION (N/A)  Patient Location: PACU  Anesthesia Type:General  Level of Consciousness: awake and alert   Airway & Oxygen Therapy: Patient Spontanous Breathing and Patient connected to nasal cannula oxygen  Post-op Assessment: Report given to PACU RN, Post -op Vital signs reviewed and stable and Patient moving all extremities X 4  Post vital signs: Reviewed and stable  Complications: No apparent anesthesia complications

## 2013-04-30 NOTE — H&P (Signed)
No change in clinical exam H+P reviewed  

## 2013-05-01 ENCOUNTER — Inpatient Hospital Stay (HOSPITAL_COMMUNITY): Payer: Medicare PPO

## 2013-05-01 LAB — COMPREHENSIVE METABOLIC PANEL
AST: 35 U/L (ref 0–37)
Albumin: 2.8 g/dL — ABNORMAL LOW (ref 3.5–5.2)
Calcium: 8.9 mg/dL (ref 8.4–10.5)
Chloride: 100 mEq/L (ref 96–112)
Creatinine, Ser: 1.03 mg/dL (ref 0.50–1.10)
Sodium: 137 mEq/L (ref 135–145)

## 2013-05-01 LAB — CBC
Hemoglobin: 8.6 g/dL — ABNORMAL LOW (ref 12.0–15.0)
MCH: 30.3 pg (ref 26.0–34.0)
MCHC: 33.6 g/dL (ref 30.0–36.0)
Platelets: 267 10*3/uL (ref 150–400)
RDW: 12.6 % (ref 11.5–15.5)
WBC: 14.2 10*3/uL — ABNORMAL HIGH (ref 4.0–10.5)

## 2013-05-01 LAB — PROTIME-INR
INR: 1.13 (ref 0.00–1.49)
Prothrombin Time: 14.3 seconds (ref 11.6–15.2)

## 2013-05-01 MED ORDER — ONDANSETRON 4 MG PO TBDP: 4.0000 mg | ORAL_TABLET | Freq: Three times a day (TID) | ORAL | Status: DC | PRN

## 2013-05-01 MED ORDER — POLYETHYLENE GLYCOL 3350 17 GM/SCOOP PO POWD: 17.0000 g | Freq: Every day | ORAL | Status: DC

## 2013-05-01 MED ORDER — OXYCODONE-ACETAMINOPHEN 7.5-325 MG PO TABS: 1.0000 | ORAL_TABLET | Freq: Four times a day (QID) | ORAL | Status: DC | PRN

## 2013-05-01 MED ORDER — DSS 100 MG PO CAPS: 100.0000 mg | ORAL_CAPSULE | Freq: Two times a day (BID) | ORAL | Status: DC

## 2013-05-01 MED ORDER — METHOCARBAMOL 500 MG PO TABS: 500.0000 mg | ORAL_TABLET | Freq: Four times a day (QID) | ORAL | Status: DC | PRN

## 2013-05-01 NOTE — Progress Notes (Signed)
SW received a consult for possible placement. PT  At this time is recommending no follow up. CSW will make CM aware. Clinical Social Worker will sign off for now as social work intervention is no longer needed. Please consult [place] again if new need arises.   Sabino Niemann, MSW, [employer] [phone]

## 2013-05-01 NOTE — Progress Notes (Signed)
UR COMPLETED  

## 2013-05-01 NOTE — Evaluation (Signed)
Occupational Therapy Evaluation Patient Details Name: Sharon Fitzgerald MRN: [id number] DOB: 10/25/1940 Today's Date: 05/01/2013 Time: 8657-8469 OT Time Calculation (min): 32 min  OT Assessment / Plan / Recommendation History of present illness Patient is s/p L3-S1 decompression; L4-S1 posterior spinal fusion surgery POD#1. Pt had RTKA June 2014   Clinical Impression   This 72 yo female, s/p above presents to acute OT with all education completed. Will D/C from acute OT.    OT Assessment  Patient does not need any further OT services    Follow Up Recommendations  No OT follow up       Equipment Recommendations  None recommended by OT          Precautions / Restrictions Precautions Precautions: Back Precaution Comments: pt able to recall 3/3 at end of session  Required Braces or Orthoses: Spinal Brace Spinal Brace: Applied in sitting position Restrictions Weight Bearing Restrictions: No       ADL  Transfers/Ambulation Related to ADLs: Pt is at a Mod I level for transfers and ambulation with [facility] ADL Comments: Pt needs a reacher to be able to don/doff LB clothing more easily-- aware she can buy one in the gift shop if she so wishes. Also discussed either using this with a wash rag on the end to wash lower legs or feet or to buy a long handled sponge. Pt does not wear socks, so sock aid is not needed. Recommended to pt to use wet wipes for tolileting hygiene. Talked with pt about how she should wash, curl and dry her hair.      Acute Rehab OT Goals Patient Stated Goal: to go home tomorrow  Visit Information  Last OT Received On: 05/01/13 Assistance Needed: +1 History of Present Illness: Patient is s/p L3-S1 decompression; L4-S1 posterior spinal fusion surgery POD#1. Pt had RTKA June 2014       Prior Functioning     Home Living Family/patient expects to be discharged to:: Private residence Living Arrangements: Alone Available Help at Discharge: Family;Available 24  hours/day Type of Home: House Home Access: Stairs to enter [employer] of Steps: 6 Entrance Stairs-Rails: Right;Left;Can reach both Home Layout: Multi-level Alternate Level Stairs-Number of Steps: 6 Alternate Level Stairs-Rails: Right;Left;Can reach both Home Equipment: Walker - 2 wheels;Cane - single point;Shower seat;Shower seat - built in Additional Comments: has 17" toilet seat; walk in shower and has shower chair  Prior Function Level of Independence: Independent Comments: recently been amb with SPC due to pain; has been using shower chair for years due to pain Communication Communication: No difficulties Dominant Hand: Right         Vision/Perception Vision - History Patient Visual Report: No change from baseline   Cognition  Cognition Behavior During Therapy: WFL for tasks assessed/performed Overall Cognitive Status: Within Functional Limits for tasks assessed    Extremity/Trunk Assessment Upper Extremity Assessment Upper Extremity Assessment: Overall WFL for tasks assessed     Mobility Bed Mobility Bed Mobility: Rolling Right;Right Sidelying to Sit;Sitting - Scoot to Edge of Bed;Sit to Sidelying Right Rolling Right: 6: Modified independent (Device/Increase time) Right Sidelying to Sit: 6: Modified independent (Device/Increase time);HOB flat Sitting - Scoot to Edge of Bed: 7: Independent Sit to Sidelying Right: 6: Modified independent (Device/Increase time);HOB flat Transfers Transfers: Sit to Stand;Stand to Sit Sit to Stand: 6: Modified independent (Device/Increase time);With upper extremity assist;From bed Stand to Sit: 6: Modified independent (Device/Increase time);With upper extremity assist;To bed  Pt ambulated half way around the unit with  uni-lateral hand held A (due to not having cane here presently)         End of Session OT - End of Session Equipment Utilized During Treatment: Back brace Activity Tolerance: Patient tolerated treatment  well Patient left: in bed;with call bell/phone within reach       Evette Georges [phone] 05/01/2013, 3:40 PM

## 2013-05-01 NOTE — Progress Notes (Signed)
    Subjective: Procedure(s) (LRB): L3-S1 DECOMPRESSION, L4-S1 INSTRUMENTED POSTERIOR SPINAL FUSION (N/A) 1 Day Post-Op  Patient reports pain as 2 out of 10.  Reports decreased leg pain reports incisional back pain   Positive void Negative bowel movement Positive flatus Negative chest pain or shortness of breath  Objective: Vital signs in last 24 hours: Temp:  [97.7 F (36.5 C)-98.7 F (37.1 C)] 98.7 F (37.1 C) (09/26 0629) Pulse Rate:  [68-83] 74 (09/26 0629) Resp:  [7-18] 18 (09/26 0629) BP: (100-167)/(1-88) 100/69 mmHg (09/26 0629) SpO2:  [96 %-100 %] 99 % (09/26 0629)  Intake/Output from previous day: 09/25 0701 - 09/26 0700 In: 5060 [P.O.:360; I.V.:4700] Out: 2530 [Urine:1450; Drains:430; Blood:650]  Labs: No results found for this basename: WBC, RBC, HCT, PLT,  in the last 72 hours No results found for this basename: NA, K, CL, CO2, BUN, CREATININE, GLUCOSE, CALCIUM,  in the last 72 hours No results found for this basename: LABPT, INR,  in the last 72 hours  Physical Exam: Neurologically intact ABD soft Neurovascular intact Incision: dressing C/D/I Compartment soft  Assessment/Plan: Patient stable  xrays satisfactory Continue mobilization with physical therapy Continue care  Advance diet Up with therapy Plan for discharge tomorrow Discharge home with home health Drain with 430 output since surgery.  Will see about d/c in AM If cleared by PT and drain is removed then ok for d/c to home Saturday.  Venita Lick, MD [facility] [phone]

## 2013-05-01 NOTE — Op Note (Signed)
NAMERAJVI, ARMENTOR NO.:  [id number]  MEDICAL RECORD NO.:  [id number]  LOCATION:  [id number]                        FACILITY:  [facility]  PHYSICIAN:  Alvy Beal, MD    DATE OF BIRTH:  1941-07-01  DATE OF PROCEDURE:  04/30/2013 DATE OF DISCHARGE:                              OPERATIVE REPORT   PREOPERATIVE DIAGNOSES: 1. Lumbar degenerative spinal stenosis, L3-S1. 2. Degenerative spinal stenosis, L3-S1. 3. Degenerative spondylolisthesis, L4-5, L5-S1.  POSTOPERATIVE DIAGNOSES: 1. Lumbar degenerative spinal stenosis, L3-S1. 2. Degenerative spinal stenosis, L3-S1. 3. Degenerative spondylolisthesis, L4-5, L5-S1.  OPERATIVE PROCEDURES: 1. Posterior decompression, L3-S1, with lateral mass decompression and     foraminotomies, and partial facetectomies. 2. Posterolateral arthrodesis, L4-5, L5-S1 with combination of     autograft from decompression and allograft (contour) bone graft. 3. Posterolateral instrumented fusion, L4-5, L5-S1 with a DePuy     [employer] pedicle screw system.  COMPLICATIONS:  None.  Intraoperative EMG neuromonitoring was performed.  There were no abnormalities noted.  All 6 screws tested satisfactory with no evidence of breach.  HISTORY:  This is a very pleasant woman who has been complaining of severe back, buttock, and bilateral leg pain, right side worse than the left.  Despite appropriate conservative management, she continued to have severe debilitating pain.  As a result, we elected to proceed with the aforementioned procedure.  All appropriate risks, benefits, and alternatives were discussed with the patient and consent was obtained.  OPERATIVE NOTE:  The patient was brought to the operating room, placed supine on the operating table.  After successful induction of general anesthesia and endotracheal intubation, TEDs, SCDs, and a Foley as well as all the neuromonitoring needles were placed.  She was turned prone onto the spine frame.   All bony prominences were well-padded and the back was prepped and draped in a standard fashion.  The x-rays were placed up and she was noted to have transitional anatomy.  I spoke directly with the neuroradiologist and we confirmed the appropriate numbering.  This was confirmed when we compared the MRI to her plain x- rays and intraoperative fluoro.  Once I was set on the numbering, we then did a time-out and confirmed the patient, procedure, and all other pertinent important data.  The back incision was infiltrated with 0.25% Marcaine with epinephrine and a midline incision was made.  Sharp dissection was carried out down to the deep fascia.  Deep fascia was sharply incised and exposed from the L3 to the S1 spinous process.  Using a Cobb elevator, I stripped the paraspinal muscles to expose the spinous process and lamina at L3, L4, L5, and S1.  This was done bilaterally.  I then went out over the L3-4 facet complex, keeping the facet capsule intact to expose the L4 transverse process.  I then removed the facet capsule at L4-5 and exposed the L5 transverse process, and I decorticated the L5-S1 facet complex and exposed a portion of sacral ala.  This was done bilaterally. At this point, I had excellent exposure posterior for my instrumentation bone grafting and decompression.  In the lateral x-ray, I confirmed the L4 pedicle.  An awl was placed on the midportion  of the transverse process where it was junction to the facet complex.  I advanced the awl through the pedicle.  I then placed a pedicle probe through the pedicle and into the vertebral body.  I repeated this at L5 and at S1.  I then went to the contralateral side and repeated this.  Once all 6 pedicles were cannulated, I placed my pedicle markers into the pedicles and then proceeded my decompression.  I removed the spinous process of S1, L5, and L4, and a portion of that of L3.  I then noted there is significant facet  hypertrophy and cyst formation at L5-S1, L4-5, and to a lesser degree at L3-4.  There was significant thickening of the ligamentum flavum and severe spinal stenosis, especially at L4-5.  Using a fine nerve hook, I developed a plane underneath the L4 lamina and used a 2 and 3 mm Kerrison to perform a central decompression.  I then used my [facility] to develop a plane underneath the remaining portion of the lamina, placed a neural patty, and then continued my central decompression superiorly.  I then completed the L4 laminectomy and then performed a slight laminotomy of L3.  Once the central decompression was completed, I went inferiorly and took down the L5 lamina in its entirety.  Once the L5 laminectomy was completed, I had completed my essential decompression from L3 to S1.  I then continued developing planes using a Penfield 4 in the lateral recess and used a 2 and 3 mm Kerrison to resect the thickened buckled ligamentum flavum, relieving the lateral recess stenosis.  I then went into the foramen of S1, L5, and L4 and performed a foraminotomy of each of these levels.  There was significant overgrowth of the facet complex and osteophytes, all which were resected.  I then went above the L4 pedicle.  At this point, I could easily visualize the medial borders of the L4, L5, and S1 pedicles, and I could palpate my Woodson superior to the L4 pedicle out the L4 foramen, L5 foramen, and S1 foramen.  At this point, I had an adequate central lateral recess and foraminal decompression from L3 to S1.  I then went to the contralateral side of the table and using the same technique I had used on one side, I performed a decompression on the other side, again decompressing from L3 down to S1.  At this point, there was much more the thecal sac had expanded from its severely compressed state.  At this point, I was very pleased with the decompression.  I irrigated the wound copiously with normal  saline and obtained hemostasis using bipolar electrocautery and FloSeal.  I then placed a sponge over the exposed thecal sac and then went back to instrumentation.  I removed each pedicle probe that I placed, palpated the pedicle with a ball-tip Feeler, tapped, and then repalpated with ball-tip Feeler.  I then placed a 40-mm length screw at L4 and repeated this procedure at L5 and S1 bilaterally.  Once all 6 screws were positioned, I then stimulated all 6 screws and each of them stimulated adequately and there was no evidence electrodiagnostically breach.  In addition to this, I went back and palpated and visualized the pedicles themselves and there was no direct breach based on observation.  I then obtained to use 75-mm rods, secured them into the pedicle screws, and locked them into place. The top locks were torqued down according to manufacturer's standards. At this point, a cross-link  was placed just below the L5 screw to provide torsional rigidity.  I then decorticated the transverse processes of L4-5 and the sacral ala and bone graft that had been harvested during the decompression was placed in the lateral gutter along with a strip of allograft bone.  I then copiously irrigated this wound with normal saline and made sure I had hemostasis.  I then placed a thrombin-soaked Gelfoam patty over the exposed thecal sac.  Because of her age and duration of the procedure, I also put antibiotic impregnated vancomycin beads into the wound to help prevent decrease the risk of infection.  I then placed a deep drain to prevent hematoma formation and closed the deep fascia with interrupted #1 Vicryl sutures and a running 0 Vicryl suture, 2-0 Vicryl, interrupted 3-0 Monocryl.  Steri-Strips and dry dressing were then applied.  The patient was ultimately extubated and transferred to the PACU without incident.  At the end of the case, all needle and sponge counts were correct.  There was no  adverse intraoperative events.  First assistant was Zonia Kief, my PA.     Alvy Beal, MD     DDB/MEDQ  D:  04/30/2013  T:  05/01/2013  Job:  [id number]

## 2013-05-01 NOTE — Evaluation (Signed)
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  Patient will benefit from skilled PT to increase their independence and safety with mobility (while adhering to their precautions) to allow discharge home with daughter. Pt highly motivated. Patient needs to practice stairs next session for D/C home.        PT Assessment  Patient needs continued PT services    Follow Up Recommendations  Supervision - Intermittent;Supervision for mobility/OOB    Does the patient have the potential to tolerate intense rehabilitation      Barriers to Discharge   none    Equipment Recommendations  None recommended by PT    Recommendations for Other Services OT consult   Frequency Min 5X/week    Precautions / Restrictions Precautions Precautions: Back Precaution Booklet Issued: Yes (comment) Precaution Comments: pt given back precautions handout; pt able to recall 3/3 at end of session  Restrictions Weight Bearing Restrictions: No   Pertinent Vitals/Pain 2/10 patient repositioned for comfort in chair        Mobility  Bed Mobility Bed Mobility: Rolling Right;Right Sidelying to Sit Rolling Right: 5: Supervision;With rail Right Sidelying to Sit: 5: Supervision;HOB flat Details for Bed Mobility Assistance: HOB flattened to simulate home enviroment; supervision for cues for log rolling technique  Transfers Transfers: Sit to Stand;Stand to Sit Sit to Stand: 5: Supervision;From bed Stand to Sit: 5: Supervision;To chair/3-in-1;With armrests Details for Transfer Assistance: supervision for cues to adhere to back  precautions   Ambulation/Gait Ambulation/Gait Assistance: 4: Min guard Ambulation Distance (Feet): 400 Feet Assistive device: 1 person hand held assist Ambulation/Gait Assistance Details: pt required handheld (A) to steady; will benefit from [facility] for balance; min cues to adhere to back precautions with turns Gait Pattern: Decreased stride length;Narrow base of support Gait velocity: decreased Stairs: No Wheelchair Mobility Wheelchair Mobility: No    Exercises General Exercises - Lower Extremity Ankle Circles/Pumps: AROM;10 reps   PT Diagnosis: Abnormality of gait;Acute pain  PT Problem List: Decreased mobility;Decreased balance;Decreased safety awareness;Pain;Decreased knowledge of precautions PT Treatment Interventions: DME instruction;Gait training;Stair training;Functional mobility training;Therapeutic activities;Neuromuscular re-education;Balance training;Patient/family education     PT Goals(Current goals can be found in the care plan section) Acute Rehab PT Goals Patient Stated Goal: to go home tomorrow PT Goal Formulation: With patient Time For Goal Achievement: 05/03/13 Potential to Achieve Goals: Good  Visit Information  Last PT Received On: 05/01/13 Assistance Needed: +1 History of Present Illness: 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       Prior Functioning  Home Living Family/patient expects to be discharged to:: Private residence Living Arrangements: Alone Available Help at Discharge: Family;Available 24 hours/day;Other (Comment) (Daughter) Type of Home: House Home Access: Stairs to enter [employer] of Steps: 6 Entrance Stairs-Rails: Right;Left;Can reach both Home Layout: Multi-level Alternate Level Stairs-Number of Steps: 6 Alternate Level Stairs-Rails: Right;Left;Can reach both Home Equipment: Walker - 2 wheels;Cane - single point;Shower seat Additional Comments: has 17" toilet seat; walk in shower and has shower chair  Prior Function Level of  Independence: Independent Comments: recently been amb with SPC due to pain; has been using shower chair for years due to pain Communication Communication:  No difficulties Dominant Hand: Right    Cognition  Cognition Arousal/Alertness: Awake/alert Behavior During Therapy: WFL for tasks assessed/performed Overall Cognitive Status: Within Functional Limits for tasks assessed    Extremity/Trunk Assessment Upper Extremity Assessment Upper Extremity Assessment: Defer to OT evaluation Lower Extremity Assessment Lower Extremity Assessment: Overall WFL for tasks assessed Cervical / Trunk Assessment Cervical / Trunk Assessment: Normal   Balance Balance Balance Assessed: Yes Static Standing Balance Static Standing - Balance Support: Right upper extremity supported;During functional activity Static Standing - Level of Assistance: 5: Stand by assistance  End of Session PT - End of Session Equipment Utilized During Treatment: Back brace Activity Tolerance: Patient tolerated treatment well Patient left: in chair;with call bell/phone within reach Nurse Communication: Mobility status  GP     Donell Sievert, [place] [phone] 05/01/2013, 10:20 AM

## 2013-05-02 NOTE — Progress Notes (Signed)
   Subjective: 2 Days Post-Op Procedure(s) (LRB): L3-S1 DECOMPRESSION, L4-S1 INSTRUMENTED POSTERIOR SPINAL FUSION (N/A)  Pt c/o mild soreness but overall doing much better Ready for d/c home Patient reports pain as mild.  Objective:   VITALS:   Filed Vitals:   05/02/13 0515  BP: 110/60  Pulse:   Temp:   Resp:     Lumbar incision healing well nv intact distally  No rashes or edema Drain pulled  LABS  Recent Labs  05/01/13 0500  HGB 8.6*  HCT 25.6*  WBC 14.2*  PLT 267     Recent Labs  05/01/13 0500  NA 137  K 4.4  BUN 11  CREATININE 1.03  GLUCOSE 132*     Assessment/Plan: 2 Days Post-Op Procedure(s) (LRB): L3-S1 DECOMPRESSION, L4-S1 INSTRUMENTED POSTERIOR SPINAL FUSION (N/A) D/c home today F/u in 2 weeks Pt in agreement     [employer], MPAS, PA-C  05/02/2013, 7:11 AM

## 2013-05-02 NOTE — Progress Notes (Signed)
Sharon Fitzgerald to be D/C'd Home per MD order.  Discussed with the patient and all questions fully answered.    Medication List    STOP taking these medications       aspirin 325 MG tablet     HYDROcodone-acetaminophen 5-325 MG per tablet  Commonly known as:  NORCO/VICODIN      TAKE these medications       cholecalciferol 1000 UNITS tablet  Commonly known as:  VITAMIN D  Take 1,000 Units by mouth daily as needed (for vitamin).     DSS 100 MG Caps  Take 100 mg by mouth 2 (two) times daily.     estrogen (conjugated)-medroxyprogesterone 0.625-2.5 MG per tablet  Commonly known as:  PREMPRO  Take 1 tablet by mouth daily.     lisinopril-hydrochlorothiazide 10-12.5 MG per tablet  Commonly known as:  PRINZIDE,ZESTORETIC  Take 1 tablet by mouth daily.     methocarbamol 500 MG tablet  Commonly known as:  ROBAXIN  Take 1 tablet (500 mg total) by mouth every 6 (six) hours as needed (spasms).     ondansetron 4 MG disintegrating tablet  Commonly known as:  ZOFRAN ODT  Take 1 tablet (4 mg total) by mouth every 8 (eight) hours as needed for nausea.     oxyCODONE-acetaminophen 7.5-325 MG per tablet  Commonly known as:  PERCOCET  Take 1-2 tablets by mouth every 6 (six) hours as needed for pain.     polyethylene glycol powder powder  Commonly known as:  GLYCOLAX/MIRALAX  Take 17 g by mouth daily.     vitamin B-12 1000 MCG tablet  Commonly known as:  CYANOCOBALAMIN  Take 1,000 mcg by mouth daily as needed (for vitamin).        VVS, Skin clean, dry and intact without evidence of skin break down, no evidence of skin tears noted. IV catheter discontinued intact. An After Visit Summary was printed and given to the patient. Follow up appointments , new prescriptions and medication administration times given. Pt care instructions for incision and back care given and teach back Patient escorted via WC, and D/C home via private auto.  Cindra Eves, RN 05/02/2013 1:15 PM

## 2013-05-02 NOTE — Progress Notes (Signed)
Physical Therapy Treatment Patient Details Name: Sharon Fitzgerald MRN: [id number] DOB: 04/04/1941 Today's Date: 05/02/2013 Time: 4098-1191 PT Time Calculation (min): 14 min  PT Assessment / Plan / Recommendation  History of Present Illness Patient is s/p L3-S1 decompression; L4-S1 posterior spinal fusion surgery POD#1. Pt had RTKA June 2014   PT Comments   Pt moving well.  Completed stair training.  Safe to d/c home from mobility standpoint.    Follow Up Recommendations  Supervision - Intermittent;Supervision for mobility/OOB     Does the patient have the potential to tolerate intense rehabilitation     Barriers to Discharge        Equipment Recommendations  None recommended by PT    Recommendations for Other Services    Frequency Min 5X/week   Progress towards PT Goals Progress towards PT goals: Progressing toward goals  Plan Current plan remains appropriate    Precautions / Restrictions Precautions Precautions: Back Precaution Comments: pt able to recall 3/3 at end of session  Required Braces or Orthoses: Spinal Brace Spinal Brace: Applied in sitting position Restrictions Weight Bearing Restrictions: No   Pertinent Vitals/Pain C/o appropriate incisional pain/discomfort.      Mobility  Bed Mobility Bed Mobility: Not assessed Transfers Transfers: Sit to Stand;Stand to Sit Sit to Stand: 6: Modified independent (Device/Increase time);With upper extremity assist;With armrests;From chair/3-in-1 Stand to Sit: 6: Modified independent (Device/Increase time);With upper extremity assist;With armrests;To chair/3-in-1 Ambulation/Gait Ambulation/Gait Assistance: 5: Supervision Ambulation Distance (Feet): 500 Feet Assistive device: Straight cane Ambulation/Gait Assistance Details: used SPC.  Pt steady.  Min cueing to increase upright posture.   Gait Pattern: Step-through pattern;Decreased stride length Stairs: Yes Stairs Assistance: 5: Supervision Stairs Assistance Details  (indicate cue type and reason): Pt able to perform safely Stair Management Technique: Two rails;Step to pattern;Forwards Number of Stairs: 5 Wheelchair Mobility Wheelchair Mobility: No      PT Goals (current goals can now be found in the care plan section) Acute Rehab PT Goals Patient Stated Goal: to go home tomorrow PT Goal Formulation: With patient Time For Goal Achievement: 05/03/13 Potential to Achieve Goals: Good  Visit Information  Last PT Received On: 05/02/13 Assistance Needed: +1 History of Present Illness: Patient is s/p L3-S1 decompression; L4-S1 posterior spinal fusion surgery POD#1. Pt had RTKA June 2014    Subjective Data  Patient Stated Goal: to go home tomorrow   Cognition  Cognition Arousal/Alertness: Awake/alert Behavior During Therapy: WFL for tasks assessed/performed Overall Cognitive Status: Within Functional Limits for tasks assessed    Balance     End of Session PT - End of Session Equipment Utilized During Treatment: Back brace Activity Tolerance: Patient tolerated treatment well Patient left: in chair;with call bell/phone within reach;with family/visitor present Nurse Communication: Mobility status   GP     Lara Mulch 05/02/2013, 9:41 AM   Verdell Face, PTA [phone] 05/02/2013

## 2013-05-02 NOTE — Discharge Summary (Signed)
Physician Discharge Summary   Patient ID: Sharon Fitzgerald MRN: [id number] DOB/AGE: Sep 19, 1940 72 y.o.  Admit date: 04/30/2013 Discharge date: 05/02/2013  Admission Diagnoses:  Lumbar back pain  Discharge Diagnoses:  Same   Surgeries: Procedure(s): L3-S1 DECOMPRESSION, L4-S1 INSTRUMENTED POSTERIOR SPINAL FUSION on 04/30/2013   Consultants: PT/OT  Discharged Condition: Stable  Hospital Course: Sharon Fitzgerald is an 72 y.o. female who was admitted 04/30/2013 with a chief complaint of No chief complaint on file. , and found to have a diagnosis of <principal problem not specified>.  They were brought to the operating room on 04/30/2013 and underwent the above named procedures.    The patient had an uncomplicated hospital course and was stable for discharge.  Recent vital signs:  Filed Vitals:   05/02/13 0515  BP: 110/60  Pulse:   Temp:   Resp:     Recent laboratory studies:  Results for orders placed during the hospital encounter of 04/30/13  CBC      Result Value Range   WBC 14.2 (*) 4.0 - 10.5 K/uL   RBC 2.84 (*) 3.87 - 5.11 MIL/uL   Hemoglobin 8.6 (*) 12.0 - 15.0 g/dL   HCT 40.9 (*) 81.1 - 91.4 %   MCV 90.1  78.0 - 100.0 fL   MCH 30.3  26.0 - 34.0 pg   MCHC 33.6  30.0 - 36.0 g/dL   RDW 78.2  95.6 - 21.3 %   Platelets 267  150 - 400 K/uL  PROTIME-INR      Result Value Range   Prothrombin Time 14.3  11.6 - 15.2 seconds   INR 1.13  0.00 - 1.49  COMPREHENSIVE METABOLIC PANEL      Result Value Range   Sodium 137  135 - 145 mEq/L   Potassium 4.4  3.5 - 5.1 mEq/L   Chloride 100  96 - 112 mEq/L   CO2 28  19 - 32 mEq/L   Glucose, Bld 132 (*) 70 - 99 mg/dL   BUN 11  6 - 23 mg/dL   Creatinine, Ser 0.86  0.50 - 1.10 mg/dL   Calcium 8.9  8.4 - 57.8 mg/dL   Total Protein 5.8 (*) 6.0 - 8.3 g/dL   Albumin 2.8 (*) 3.5 - 5.2 g/dL   AST 35  0 - 37 U/L   ALT 21  0 - 35 U/L   Alkaline Phosphatase 77  39 - 117 U/L   Total Bilirubin 0.3  0.3 - 1.2 mg/dL   GFR calc non Af Amer  53 (*) >90 mL/min   GFR calc Af Amer 62 (*) >90 mL/min    Discharge Medications:     Medication List    STOP taking these medications       aspirin 325 MG tablet     HYDROcodone-acetaminophen 5-325 MG per tablet  Commonly known as:  NORCO/VICODIN      TAKE these medications       cholecalciferol 1000 UNITS tablet  Commonly known as:  VITAMIN D  Take 1,000 Units by mouth daily as needed (for vitamin).     DSS 100 MG Caps  Take 100 mg by mouth 2 (two) times daily.     estrogen (conjugated)-medroxyprogesterone 0.625-2.5 MG per tablet  Commonly known as:  PREMPRO  Take 1 tablet by mouth daily.     lisinopril-hydrochlorothiazide 10-12.5 MG per tablet  Commonly known as:  PRINZIDE,ZESTORETIC  Take 1 tablet by mouth daily.     methocarbamol 500 MG tablet  Commonly  known as:  ROBAXIN  Take 1 tablet (500 mg total) by mouth every 6 (six) hours as needed (spasms).     ondansetron 4 MG disintegrating tablet  Commonly known as:  ZOFRAN ODT  Take 1 tablet (4 mg total) by mouth every 8 (eight) hours as needed for nausea.     oxyCODONE-acetaminophen 7.5-325 MG per tablet  Commonly known as:  PERCOCET  Take 1-2 tablets by mouth every 6 (six) hours as needed for pain.     polyethylene glycol powder powder  Commonly known as:  GLYCOLAX/MIRALAX  Take 17 g by mouth daily.     vitamin B-12 1000 MCG tablet  Commonly known as:  CYANOCOBALAMIN  Take 1,000 mcg by mouth daily as needed (for vitamin).        Diagnostic Studies: Dg Lumbar Spine 2-3 Views  05/01/2013   CLINICAL DATA:  Postoperative for lumbar spine surgery.  EXAM: LUMBAR SPINE - 2-3 VIEW  COMPARISON:  04/30/2013  FINDINGS: Posterolateral rod and pedicle screw fixation noted at L4-L5-S1 with graft material noted and a drain in place. There continues to be 3-4 mm of anterolisthesis at L4-5 along with underlying degenerative facet arthropathy at L3-4, L4-5, and L5-S1. Posterior decompression at L3, L4, and L5. There is mild  levoconvex lumbar scoliosis.  IMPRESSION: 1. Status post posterior decompression and posterolateral instrumented fusion in the lower lumbar spine. No complicating feature observed.   Electronically Signed   By: Herbie Baltimore   On: 05/01/2013 07:40   Dg Lumbar Spine 2-3 Views  04/30/2013   *RADIOLOGY REPORT*  Clinical Data: L4 - S1 fusion and decompression  DG C-ARM GT 120 MIN,LUMBAR SPINE - 2-3 VIEW  Comparison:  Intraoperative lumbar spine localization radiographic images - earlier same day; lumbar spine radiographs - 04/24/2013  Findings:  Two spot intraoperative fluoroscopic images of the lower lumbar spine provided for review.  Lumbar spine labeling is in keeping with intraoperative lumbar spine localization radiograph performed earlier same day.  Images demonstrate the sequela of L4 - S1 paraspinal fusion.  No evidence of hardware failure or loosening.  No radiopaque foreign body.  IMPRESSION: Post L4 - S1 paraspinal fusion.   Original Report Authenticated By: Tacey Ruiz, MD   Dg Lumbar Spine 2-3 Views  04/24/2013   CLINICAL DATA:  Preop lumbar surgery.  EXAM: LUMBAR SPINE - 2-3 VIEW  COMPARISON:  08/02/2009.  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 On the prior MR, this was labeled S1 with a rudimentary disc at the S1-2 level. As the lateral view does not completely incorporate the upper sacrum, prior to labeling plain films, lateral view centered more inferiorly recommended (to incorporate the mid to lower lumbar vertebra and sacrum).  This additional view is presently being arranged by Nani Skillern radiology technologist.  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 On the prior MR, this was labeled S1 with a rudimentary disc at the S1-2 level. As the lateral view does not completely incorporate the upper sacrum, prior to labeling plain films, lateral view centered more inferiorly recommended (to incorporate the mid to lower lumbar vertebra and sacrum).   Electronically Signed   By: Bridgett Larsson   On: 04/24/2013 10:19   Dg Lumbar Spine 1 View  04/30/2013   CLINICAL DATA:  Lumbar spine surgery. Identify levels.  EXAM: LUMBAR SPINE - 1 VIEW  COMPARISON:  Lumbar spine radiographs 04/24/2013. MRI lumbar spine 08/02/2009.  FINDINGS: A single intraoperative lateral view of the lumbar spine demonstrates anterolisthesis at L4-5  and L5-S1. Transitional anatomy is again noted. The images are labeled in accordance with the previous MRI.  IMPRESSION: 1. Intraoperative localization of the lumbar spine levels as detailed above.   Electronically Signed   By: Gennette Pac   On: 04/30/2013 08:27   Dg C-arm Gt 120 Min  04/30/2013   *RADIOLOGY REPORT*  Clinical Data: L4 - S1 fusion and decompression  DG C-ARM GT 120 MIN,LUMBAR SPINE - 2-3 VIEW  Comparison:  Intraoperative lumbar spine localization radiographic images - earlier same day; lumbar spine radiographs - 04/24/2013  Findings:  Two spot intraoperative fluoroscopic images of the lower lumbar spine provided for review.  Lumbar spine labeling is in keeping with intraoperative lumbar spine localization radiograph performed earlier same day.  Images demonstrate the sequela of L4 - S1 paraspinal fusion.  No evidence of hardware failure or loosening.  No radiopaque foreign body.  IMPRESSION: Post L4 - S1 paraspinal fusion.   Original Report Authenticated By: Tacey Ruiz, MD    Disposition: 06-Home-Health Care Svc      Discharge Orders   Future Orders Complete By Expires   Call MD / Call 911  As directed    Comments:     If you experience chest pain or shortness of breath, CALL 911 and be transported to the hospital emergency room.  If you develope a fever above 101 F, pus (white drainage) or increased drainage or redness at the wound, or calf pain, call your surgeon's office.   Call MD / Call 911  As directed    Comments:     If you experience chest pain or shortness of breath, CALL 911 and be transported to the hospital emergency room.  If you  develope a fever above 101 F, pus (white drainage) or increased drainage or redness at the wound, or calf pain, call your surgeon's office.   Constipation Prevention  As directed    Comments:     Drink plenty of fluids.  Prune juice may be helpful.  You may use a stool softener, such as Colace (over the counter) 100 mg twice a day.  Use MiraLax (over the counter) for constipation as needed.   Constipation Prevention  As directed    Comments:     Drink plenty of fluids.  Prune juice may be helpful.  You may use a stool softener, such as Colace (over the counter) 100 mg twice a day.  Use MiraLax (over the counter) for constipation as needed.   Diet - low sodium heart healthy  As directed    Diet - low sodium heart healthy  As directed    Discharge instructions  As directed    Comments:     Ok to shower 5 days postop.  Do not apply any creams or ointments to incision.  Do not remove steri-strips.  Can use 4x4 gauze and tape for dressing changes.  No aggressive activity.  No bending, squatting or prolonged sitting.  Mostly be in reclined position or lying down.  Wear brace at all times except for showering.   Driving restrictions  As directed    Comments:     No driving until further notice.   Increase activity slowly as tolerated  As directed    Increase activity slowly as tolerated  As directed    Lifting restrictions  As directed    Comments:     No lifting until further notice.      Follow-up Information   Schedule an appointment as  soon as possible for a visit with Alvy Beal, MD. (need return office visit 2 weeks postop)    Specialty:  Orthopedic Surgery   Contact information:   [address] [place] [address] [phone]        Signed: Thea Gist 05/02/2013, 7:13 AM

## 2013-05-05 ENCOUNTER — Encounter (HOSPITAL_COMMUNITY): Payer: Self-pay | Admitting: Orthopedic Surgery

## 2013-05-21 ENCOUNTER — Telehealth: Payer: Self-pay | Admitting: Internal Medicine

## 2013-05-21 NOTE — Telephone Encounter (Signed)
S/W PT IN REF TO NP APPT. ON 06/02/13- 1:30 REFERRING DR WEBB DX- EVEVATED WBC ,ELEVATED PLATELETS MAILED NP PACKET

## 2013-05-21 NOTE — Telephone Encounter (Signed)
C/D 05/21/13 for appt. 06/02/13

## 2013-06-02 ENCOUNTER — Encounter: Payer: Self-pay | Admitting: Internal Medicine

## 2013-06-02 ENCOUNTER — Ambulatory Visit (HOSPITAL_BASED_OUTPATIENT_CLINIC_OR_DEPARTMENT_OTHER): Payer: Medicare PPO | Admitting: Lab

## 2013-06-02 ENCOUNTER — Other Ambulatory Visit (HOSPITAL_BASED_OUTPATIENT_CLINIC_OR_DEPARTMENT_OTHER): Payer: Medicare PPO | Admitting: Lab

## 2013-06-02 ENCOUNTER — Encounter (INDEPENDENT_AMBULATORY_CARE_PROVIDER_SITE_OTHER): Payer: Self-pay

## 2013-06-02 ENCOUNTER — Telehealth: Payer: Self-pay | Admitting: *Deleted

## 2013-06-02 ENCOUNTER — Ambulatory Visit (HOSPITAL_BASED_OUTPATIENT_CLINIC_OR_DEPARTMENT_OTHER): Payer: Medicare PPO | Admitting: Internal Medicine

## 2013-06-02 ENCOUNTER — Ambulatory Visit: Payer: Medicare PPO

## 2013-06-02 VITALS — BP 138/73 | HR 77 | Temp 98.3°F | Resp 19 | Ht 63.0 in | Wt 175.4 lb

## 2013-06-02 DIAGNOSIS — D72829 Elevated white blood cell count, unspecified: Secondary | ICD-10-CM

## 2013-06-02 DIAGNOSIS — D539 Nutritional anemia, unspecified: Secondary | ICD-10-CM

## 2013-06-02 DIAGNOSIS — D473 Essential (hemorrhagic) thrombocythemia: Secondary | ICD-10-CM

## 2013-06-02 DIAGNOSIS — D649 Anemia, unspecified: Secondary | ICD-10-CM

## 2013-06-02 DIAGNOSIS — D75839 Thrombocytosis, unspecified: Secondary | ICD-10-CM

## 2013-06-02 LAB — CBC WITH DIFFERENTIAL/PLATELET
Basophils Absolute: 0.1 10*3/uL (ref 0.0–0.1)
Eosinophils Absolute: 0.1 10*3/uL (ref 0.0–0.5)
HCT: 32.5 % — ABNORMAL LOW (ref 34.8–46.6)
HGB: 10.3 g/dL — ABNORMAL LOW (ref 11.6–15.9)
MCV: 91 fL (ref 79.5–101.0)
MONO%: 5.4 % (ref 0.0–14.0)
NEUT#: 7.8 10*3/uL — ABNORMAL HIGH (ref 1.5–6.5)
NEUT%: 70.8 % (ref 38.4–76.8)
RBC: 3.57 10*6/uL — ABNORMAL LOW (ref 3.70–5.45)
RDW: 13.7 % (ref 11.2–14.5)
WBC: 11 10*3/uL — ABNORMAL HIGH (ref 3.9–10.3)
lymph#: 2.5 10*3/uL (ref 0.9–3.3)

## 2013-06-02 LAB — COMPREHENSIVE METABOLIC PANEL (CC13)
ALT: 7 U/L (ref 0–55)
Albumin: 3.4 g/dL — ABNORMAL LOW (ref 3.5–5.0)
Alkaline Phosphatase: 127 U/L (ref 40–150)
Anion Gap: 12 mEq/L — ABNORMAL HIGH (ref 3–11)
BUN: 16.2 mg/dL (ref 7.0–26.0)
CO2: 24 mEq/L (ref 22–29)
Calcium: 9.8 mg/dL (ref 8.4–10.4)
Chloride: 105 mEq/L (ref 98–109)
Glucose: 95 mg/dl (ref 70–140)
Potassium: 3.5 mEq/L (ref 3.5–5.1)
Total Bilirubin: 0.32 mg/dL (ref 0.20–1.20)

## 2013-06-02 LAB — LACTATE DEHYDROGENASE (CC13): LDH: 178 U/L (ref 125–245)

## 2013-06-02 LAB — IRON AND TIBC CHCC
TIBC: 315 ug/dL (ref 236–444)
UIBC: 275 ug/dL (ref 120–384)

## 2013-06-02 NOTE — Progress Notes (Signed)
Checked in new pt with no financial concerns. °

## 2013-06-02 NOTE — Progress Notes (Signed)
[facility] [phone]   [phone]  CONSULT NOTE  REFERRING PHYSICIAN: Dr. Shirlean Mylar  REASON FOR CONSULTATION:  Leukocytosis and thrombocytosis  HPI ALTAMESE DEGUIRE is a 72 y.o. female with past medical history significant for hypertension, and anemia, spinal stenosis status post back surgery as well as osteoarthritis status post right knee replacement in June of 2014. The patient was seen recently by her primary care physician Dr. Hyman Hopes and CBC on 05/15/2013 showed elevated white blood count of 11.3, hemoglobin 8.6 and hematocrit 25.7% with MCV of 53.5 and platelets count of 554,000. She referred the patient to me today for further evaluation and recommendation regarding these abnormalities. Reviewing her hospital records, the patient has normal white blood count, normal hemoglobin and hematocrit as well as normal platelets count on 01/08/2013. After her right knee surgery her white blood count increased to 14.6 and hemoglobin went down to 10.3 with hematocrit of 32.0% and platelets count of 285,000. 3 months later on 04/24/2013, her CBC was normal again was normal white blood count of 9.5, normal hemoglobin 12.1, hematocrit 37.7% and platelets count of 335,000. The patient then underwent back surgery and on 05/01/2013 her white blood count increased again to 14.2, hemoglobin was down to 8.6 with hematocrit of 25.6% and platelets count were normal at 267,000. The patient is feeling fine except for mild fatigue. She had colonoscopy last year and few polyps were removed. She denied having any significant weight loss but has occasional night sweats secondary to hormonal changes and she is currently on Prempro.  The patient denied having any significant chest pain, shortness of breath, cough or hemoptysis. Her family history significant for a mother who had colon cancer and father with COPD. The patient is divorced and has 2 children a son and daughter. 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 And also add the [employer] system. She has no history of smoking but drinks alcohol occasionally and no history of drug abuse.  HPI  Past Medical History  Diagnosis Date  . Hypertension   . Arthritis   . Spinal stenosis     sees Dr. Shon Baton    Past Surgical History  Procedure Laterality Date  . Tubal ligation    . Left hand surgury from fracture    . Breast surgery Bilateral     benign fibroid cyst  . Total knee arthroplasty Right 01/15/2013    Procedure: RIGHT TOTAL KNEE ARTHROPLASTY;  Surgeon: Shelda Pal, MD;  Location: [facility];  Service: Orthopedics;  Laterality: Right;  . Colonoscopy    . Lumbar laminectomy/decompression microdiscectomy N/A 04/30/2013    Procedure: L3-S1 DECOMPRESSION, L4-S1 INSTRUMENTED POSTERIOR SPINAL FUSION;  Surgeon: Venita Lick, MD;  Location: [facility];  Service: Orthopedics;  Laterality: N/A;    History reviewed. No pertinent family history.  Social History History  Substance Use Topics  . Smoking status: Never Smoker   . Smokeless tobacco: Never Used  . Alcohol Use: Yes     Comment: very rare    No Known Allergies  Current Outpatient Prescriptions  Medication Sig Dispense Refill  . cholecalciferol (VITAMIN D) 1000 UNITS tablet Take 1,000 Units by mouth daily as needed (for vitamin).      Marland Kitchen docusate sodium 100 MG CAPS Take 100 mg by mouth 2 (two) times daily.  40 capsule  0  . estrogen, conjugated,-medroxyprogesterone (PREMPRO) 0.625-2.5 MG per tablet Take 1 tablet by mouth daily.      Marland Kitchen lisinopril-hydrochlorothiazide (  PRINZIDE,ZESTORETIC) 10-12.5 MG per tablet Take 1 tablet by mouth daily.      . vitamin B-12 (CYANOCOBALAMIN) 1000 MCG tablet Take 1,000 mcg by mouth daily as needed (for vitamin).      Marland Kitchen HYDROcodone-acetaminophen (NORCO/VICODIN) 5-325 MG per tablet 1 tablet.       No current facility-administered medications for this visit.    Review of Systems  Constitutional: positive for fatigue Eyes:  negative Ears, nose, mouth, throat, and face: negative Respiratory: negative Cardiovascular: negative Gastrointestinal: negative Genitourinary:negative Integument/breast: negative Hematologic/lymphatic: negative Musculoskeletal:negative Neurological: negative Behavioral/Psych: negative Endocrine: negative Allergic/Immunologic: negative  Physical Exam  ZOX:WRUEA, healthy, no distress, well nourished and well developed SKIN: skin color, texture, turgor are normal HEAD: Normocephalic, No masses, lesions, tenderness or abnormalities EYES: normal, PERRLA EARS: External ears normal OROPHARYNX:no exudate, no erythema and lips, buccal mucosa, and tongue normal  NECK: supple, no adenopathy, no JVD LYMPH:  no palpable lymphadenopathy, no hepatosplenomegaly BREAST:not examined LUNGS: clear to auscultation , and palpation HEART: regular rate & rhythm, no murmurs and no gallops ABDOMEN:abdomen soft, non-tender, normal bowel sounds and no masses or organomegaly BACK: Back symmetric, no curvature., No CVA tenderness EXTREMITIES:no joint deformities, effusion, or inflammation, no edema, no skin discoloration  NEURO: alert & oriented x 3 with fluent speech, no focal motor/sensory deficits  PERFORMANCE STATUS: ECOG 1  LABORATORY DATA: Lab Results  Component Value Date   WBC 14.2* 05/01/2013   HGB 8.6* 05/01/2013   HCT 25.6* 05/01/2013   MCV 90.1 05/01/2013   PLT 267 05/01/2013      Chemistry      Component Value Date/Time   NA 137 05/01/2013 0500   K 4.4 05/01/2013 0500   CL 100 05/01/2013 0500   CO2 28 05/01/2013 0500   BUN 11 05/01/2013 0500   CREATININE 1.03 05/01/2013 0500      Component Value Date/Time   CALCIUM 8.9 05/01/2013 0500   ALKPHOS 77 05/01/2013 0500   AST 35 05/01/2013 0500   ALT 21 05/01/2013 0500   BILITOT 0.3 05/01/2013 0500       RADIOGRAPHIC STUDIES: No results found.  ASSESSMENT:  This is a very pleasant 72 years old [place] American female presented for  evaluation of leukocytosis, anemia and thrombocytosis. This is most likely reactive to her recent surgery. Her CBC today showed improvement in her grandmother with white blood count of 11.0, hemoglobin 10.3, hematocrit 32.5 and platelets count 403,000.   PLAN: I had a lengthy discussion with the patient today about her current disease status. I ordered several studies today for evaluation of her condition including repeat CBC, comprehensive metabolic panel, LDH, iron study, ferritin, serum folate and serum vitamin B 12 level in addition to serum erythropoietin. I recommended for the patient to start taking oral iron tablets at a regular basis and I will start her on Integra Plus 1 capsule by mouth daily. I would see the patient back for follow up visit in 2 weeks for reevaluation with repeat CBC. She was advised to call immediately if she has any concerning symptoms in the interval.  The patient voices understanding of current disease status and treatment options and is in agreement with the current care plan.  All questions were answered. The patient knows to call the clinic with any problems, questions or concerns. We can certainly see the patient much sooner if necessary.  Thank you so much for allowing me to participate in the care of Anahli S Crombie. I will continue to follow up the  patient with you and assist in her care.  I spent 40 minutes counseling the patient face to face. The total time spent in the appointment was 60 minutes.  Kale Dols K. 06/02/2013, 3:14 PM

## 2013-06-02 NOTE — Telephone Encounter (Signed)
appts made and printed...td 

## 2013-06-03 LAB — ERYTHROPOIETIN: Erythropoietin: 19.6 m[IU]/mL — ABNORMAL HIGH (ref 2.6–18.5)

## 2013-06-03 LAB — VITAMIN B12: Vitamin B-12: 898 pg/mL (ref 211–911)

## 2013-06-03 MED ORDER — INTEGRA PLUS PO CAPS: 1.0000 | ORAL_CAPSULE | Freq: Every morning | ORAL | Status: DC

## 2013-06-03 NOTE — Patient Instructions (Signed)
followup visit in 2 weeks with repeat CBC. Start Integra plus 1 capsule by mouth daily

## 2013-06-16 ENCOUNTER — Other Ambulatory Visit (HOSPITAL_BASED_OUTPATIENT_CLINIC_OR_DEPARTMENT_OTHER): Payer: Medicare PPO | Admitting: Lab

## 2013-06-16 ENCOUNTER — Encounter: Payer: Self-pay | Admitting: Physician Assistant

## 2013-06-16 ENCOUNTER — Ambulatory Visit (HOSPITAL_BASED_OUTPATIENT_CLINIC_OR_DEPARTMENT_OTHER): Payer: Medicare PPO | Admitting: Physician Assistant

## 2013-06-16 VITALS — BP 177/88 | HR 81 | Temp 98.0°F | Resp 18 | Ht 63.0 in | Wt 176.9 lb

## 2013-06-16 DIAGNOSIS — D62 Acute posthemorrhagic anemia: Secondary | ICD-10-CM

## 2013-06-16 DIAGNOSIS — D500 Iron deficiency anemia secondary to blood loss (chronic): Secondary | ICD-10-CM

## 2013-06-16 DIAGNOSIS — D473 Essential (hemorrhagic) thrombocythemia: Secondary | ICD-10-CM

## 2013-06-16 DIAGNOSIS — D539 Nutritional anemia, unspecified: Secondary | ICD-10-CM

## 2013-06-16 DIAGNOSIS — D72829 Elevated white blood cell count, unspecified: Secondary | ICD-10-CM

## 2013-06-16 LAB — CBC WITH DIFFERENTIAL/PLATELET
BASO%: 0.5 % (ref 0.0–2.0)
Eosinophils Absolute: 0.2 10*3/uL (ref 0.0–0.5)
HCT: 34.4 % — ABNORMAL LOW (ref 34.8–46.6)
HGB: 10.8 g/dL — ABNORMAL LOW (ref 11.6–15.9)
LYMPH%: 23.4 % (ref 14.0–49.7)
MCHC: 31.3 g/dL — ABNORMAL LOW (ref 31.5–36.0)
MCV: 91.3 fL (ref 79.5–101.0)
MONO#: 0.5 10*3/uL (ref 0.1–0.9)
MONO%: 4.6 % (ref 0.0–14.0)
NEUT#: 6.8 10*3/uL — ABNORMAL HIGH (ref 1.5–6.5)
NEUT%: 69.7 % (ref 38.4–76.8)
WBC: 9.7 10*3/uL (ref 3.9–10.3)
lymph#: 2.3 10*3/uL (ref 0.9–3.3)

## 2013-06-16 NOTE — Patient Instructions (Signed)
Continue taking Integra +1 capsule daily Followup in 3 months with repeat iron studies

## 2013-06-16 NOTE — Progress Notes (Addendum)
No images are attached to the encounter. No scans are attached to the encounter. No scans are attached to the encounter. [facility] SHARED VISIT PROGRESS NOTE  Sharon Confer, MD [address] [place] [address]  DIAGNOSIS: Anemia  PRIOR THERAPY: None  CURRENT THERAPY: Integra plus, 1 capsule by mouth daily  INTERVAL HISTORY: Sharon Fitzgerald 72 y.o. female returns for a scheduled regular office visit for followup of history of anemia thought secondary to blood loss from some recent surgeries. Patient is status post right knee replacement as well as within 4 months had lumbar decompression and fusion surgery. Postop from those operations are looking good according to the patient when she went for followup appointments. She reports a little difficulty going up the stairs in terms of shortness of breath but otherwise her energy level is getting better and she has no other shortness of breath. She is taking her Integra plus daily as well as a B12 supplement. She voices no specific complaints today.   MEDICAL HISTORY: Past Medical History  Diagnosis Date  . Hypertension   . Arthritis   . Spinal stenosis     sees Dr. Shon Baton    ALLERGIES:  has No Known Allergies.  MEDICATIONS:  Current Outpatient Prescriptions  Medication Sig Dispense Refill  . cholecalciferol (VITAMIN D) 1000 UNITS tablet Take 1,000 Units by mouth daily as needed (for vitamin).      Marland Kitchen estrogen, conjugated,-medroxyprogesterone (PREMPRO) 0.625-2.5 MG per tablet Take 1 tablet by mouth daily.      Marland Kitchen FeFum-FePoly-FA-B Cmp-C-Biot (INTEGRA PLUS) CAPS Take 1 capsule by mouth every morning.  30 capsule  2  . HYDROcodone-acetaminophen (NORCO/VICODIN) 5-325 MG per tablet 1 tablet.      Marland Kitchen lisinopril-hydrochlorothiazide (PRINZIDE,ZESTORETIC) 10-12.5 MG per tablet Take 1 tablet by mouth daily.      . vitamin B-12 (CYANOCOBALAMIN) 1000 MCG tablet Take 1,000 mcg by mouth daily as needed (for vitamin).       Marland Kitchen docusate sodium 100 MG CAPS Take 100 mg by mouth 2 (two) times daily.  40 capsule  0   No current facility-administered medications for this visit.    SURGICAL HISTORY:  Past Surgical History  Procedure Laterality Date  . Tubal ligation    . Left hand surgury from fracture    . Breast surgery Bilateral     benign fibroid cyst  . Total knee arthroplasty Right 01/15/2013    Procedure: RIGHT TOTAL KNEE ARTHROPLASTY;  Surgeon: Shelda Pal, MD;  Location: [facility];  Service: Orthopedics;  Laterality: Right;  . Colonoscopy    . Lumbar laminectomy/decompression microdiscectomy N/A 04/30/2013    Procedure: L3-S1 DECOMPRESSION, L4-S1 INSTRUMENTED POSTERIOR SPINAL FUSION;  Surgeon: Venita Lick, MD;  Location: [facility];  Service: Orthopedics;  Laterality: N/A;    REVIEW OF SYSTEMS:  Constitutional: positive for fatigue and Improving Eyes: negative Ears, nose, mouth, throat, and face: negative Respiratory: positive for dyspnea on exertion and Only occasionally when climbing stairs Cardiovascular: negative Gastrointestinal: negative Genitourinary:negative Integument/breast: negative Hematologic/lymphatic: negative Musculoskeletal:positive for arthralgias Neurological: negative Behavioral/Psych: negative Endocrine: negative Allergic/Immunologic: negative   PHYSICAL EXAMINATION: General appearance: alert, cooperative, appears stated age and no distress Head: Normocephalic, without obvious abnormality, atraumatic Neck: no adenopathy, no carotid bruit, no JVD, supple, symmetrical, trachea midline and thyroid not enlarged, symmetric, no tenderness/mass/nodules Lymph nodes: Cervical, supraclavicular, and axillary nodes normal. Resp: clear to auscultation bilaterally Back: symmetric, no curvature. ROM normal. No CVA tenderness. Cardio: regular rate and rhythm, S1, S2 normal,  no murmur, click, rub or gallop GI: soft, non-tender; bowel sounds normal; no masses,  no organomegaly Extremities:  extremities normal, atraumatic, no cyanosis or edema Neurologic: Alert and oriented X 3, normal strength and tone. Normal symmetric reflexes. Normal coordination and gait  ECOG PERFORMANCE STATUS: 1 - Symptomatic but completely ambulatory  Blood pressure 177/88, pulse 81, temperature 98 F (36.7 C), temperature source Oral, resp. rate 18, height 5\' 3"  (1.6 m), weight 176 lb 14.4 oz (80.241 kg).  LABORATORY DATA: Lab Results  Component Value Date   WBC 9.7 06/16/2013   HGB 10.8* 06/16/2013   HCT 34.4* 06/16/2013   MCV 91.3 06/16/2013   PLT 374 06/16/2013      Chemistry      Component Value Date/Time   NA 141 06/02/2013 1526   NA 137 05/01/2013 0500   K 3.5 06/02/2013 1526   K 4.4 05/01/2013 0500   CL 100 05/01/2013 0500   CO2 24 06/02/2013 1526   CO2 28 05/01/2013 0500   BUN 16.2 06/02/2013 1526   BUN 11 05/01/2013 0500   CREATININE 1.1 06/02/2013 1526   CREATININE 1.03 05/01/2013 0500      Component Value Date/Time   CALCIUM 9.8 06/02/2013 1526   CALCIUM 8.9 05/01/2013 0500   ALKPHOS 127 06/02/2013 1526   ALKPHOS 77 05/01/2013 0500   AST 13 06/02/2013 1526   AST 35 05/01/2013 0500   ALT 7 06/02/2013 1526   ALT 21 05/01/2013 0500   BILITOT 0.32 06/02/2013 1526   BILITOT 0.3 05/01/2013 0500    Ferritin 76, serum iron 40, 13% saturation, folate 17.6, B12 898, erythropoietin 19.6   RADIOGRAPHIC STUDIES:  No results found.   ASSESSMENT/PLAN: Patient is a very pleasant 72 year old female with anemia. This anemia is likely related to blood loss from surgery and/R. deficiency. The patient was discussed with Sharon Fitzgerald also seen by Sharon Fitzgerald. She is encouraged to continue taking her Integra plus daily. We will have her return in 3 months with repeat CBC differential, ferritin and iron studies to reevaluate her anemia.     Sharon Fitzgerald, Sharon Delair Fitzgerald, Sharon Fitzgerald     All questions were answered. The patient knows to call the clinic with any problems, questions or concerns. We can certainly see the  patient much sooner if necessary.  ADDENDUM: Hematology/Oncology Attending: I had a face to face encounter with the patient.. I recommended her care plan. The patient is feeling fine today with no specific complaints. She is currently on Integra plus and tolerating it fairly well. She has improvement in her hemoglobin and hematocrit as well as the iron study. Advised the patient to continue her current treatment with Integra plus. She would come back for follow up visit in 3 months with repeat CBC and iron study. She was advised to call immediately if she has any concerning symptoms in the interval.  Lajuana Matte., MD. 06/17/2013

## 2013-06-17 ENCOUNTER — Telehealth: Payer: Self-pay | Admitting: Internal Medicine

## 2013-06-17 NOTE — Telephone Encounter (Signed)
lvm for pt regarding to Feb 2015 appt...mailed pt appt sched, avs and letter

## 2013-09-11 ENCOUNTER — Other Ambulatory Visit: Payer: Medicare PPO

## 2013-09-15 ENCOUNTER — Ambulatory Visit: Payer: Medicare PPO | Admitting: Internal Medicine

## 2013-09-16 ENCOUNTER — Other Ambulatory Visit: Payer: Self-pay | Admitting: *Deleted

## 2014-06-13 ENCOUNTER — Encounter: Payer: Self-pay | Admitting: *Deleted

## 2014-06-29 ENCOUNTER — Ambulatory Visit (INDEPENDENT_AMBULATORY_CARE_PROVIDER_SITE_OTHER): Payer: Medicare PPO | Admitting: Neurology

## 2014-06-29 ENCOUNTER — Encounter: Payer: Self-pay | Admitting: Neurology

## 2014-06-29 VITALS — BP 128/88 | HR 85 | Resp 16 | Ht 62.75 in | Wt 190.0 lb

## 2014-06-29 DIAGNOSIS — M6281 Muscle weakness (generalized): Secondary | ICD-10-CM

## 2014-06-29 LAB — COMPREHENSIVE METABOLIC PANEL
ALT: <8 U/L (ref 0–35)
AST: 12 U/L (ref 0–37)
Albumin: 4.1 g/dL (ref 3.5–5.2)
Alkaline Phosphatase: 117 U/L (ref 39–117)
BUN: 33 mg/dL — ABNORMAL HIGH (ref 6–23)
CO2: 25 meq/L (ref 19–32)
Calcium: 9.5 mg/dL (ref 8.4–10.5)
Chloride: 104 mEq/L (ref 96–112)
Creat: 1.24 mg/dL — ABNORMAL HIGH (ref 0.50–1.10)
Glucose, Bld: 86 mg/dL (ref 70–99)
Potassium: 4.2 mEq/L (ref 3.5–5.3)
Sodium: 137 mEq/L (ref 135–145)
TOTAL PROTEIN: 7.1 g/dL (ref 6.0–8.3)
Total Bilirubin: 0.4 mg/dL (ref 0.2–1.2)

## 2014-06-29 LAB — RHEUMATOID FACTOR: Rhuematoid fact SerPl-aCnc: <10 IU/mL (ref <=14)

## 2014-06-29 LAB — CK: CK TOTAL: 58 U/L (ref 7–177)

## 2014-06-29 LAB — SEDIMENTATION RATE: Sed Rate: 27 mm/hr — ABNORMAL HIGH (ref 0–22)

## 2014-06-29 NOTE — Progress Notes (Signed)
NEUROLOGY CONSULTATION NOTE  Sharon Fitzgerald MRN: [id number] DOB: 14-Apr-1941  Referring provider: Dr. Maurice Small Primary care provider: Dr. Maurice Small  Reason for consult:  Worsening bilateral leg fatigue  Dear Dr Justin Mend:  Thank you for your kind referral of Sharon Fitzgerald for consultation of the above symptoms. Although her history is well known to you, please allow me to reiterate it for the purpose of our medical record. Records and images were personally reviewed where available.  HISTORY OF PRESENT ILLNESS: This is a pleasant 73 year old right-handed woman with a history of hypertension, hyperlipidemia, spinal stenosis s/p L3-S1 decompression, L4-S1 posterior spinal fusion 04/2013, presenting for continued symptoms of bilateral leg fatigue despite back surgery. She reports symptoms started 10 years ago, after walking half a block, her legs would feel as if she had been running for a long time. She stops and leans down to recover, then is able to walk another half block. She was having constant back pain prior to the surgery, and reports that pain has significantly improved post-op, however the leg fatigue continues. She has seen Dr. Rolena Infante and follow-up imaging reported to look good, records requested for review. Being vertical or standing in line is "excruciating." When she shops, she uses the cart to lean down and provide relief. She can do exercises lying down or sitting (exercise bike) with no effort and no difficulties. 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 She has noticed similar but milder symptoms in her arms, after brushing or curling her hair, her arms would feel heavy. She has muscle cramps, particularly in her upper back, but has avoided activities that would worsen it. She denies any diplopia, dysarthria, dysphagia. No headaches, dizziness, bowel/bladder dysfunction. She had right knee surgery and continues to have  mild numbness on the right lateral knee. 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 She feels her right side is aching frequently. She has stiffness in her joints after sitting for a prolonged period. Recent bone scan showed osteopenia. There is no family history of similar symptoms. She has noticed significant diaphoresis, after standing or cleaning for just 5-10 minutes, beads of sweat would roll down her face and she would continue to sweat for 15-20 minutes while sitting down.   PAST MEDICAL HISTORY: Past Medical History  Diagnosis Date  . Hypertension   . Arthritis   . Spinal stenosis     sees Dr. Rolena Infante    PAST SURGICAL HISTORY: Past Surgical History  Procedure Laterality Date  . Tubal ligation    . Left hand surgury from fracture    . Breast surgery Bilateral     benign fibroid cyst  . Total knee arthroplasty Right 01/15/2013    Procedure: RIGHT TOTAL KNEE ARTHROPLASTY;  Surgeon: [name], MD;  Location: [facility];  Service: Orthopedics;  Laterality: Right;  . Colonoscopy    . Lumbar laminectomy/decompression microdiscectomy N/A 04/30/2013    Procedure: L3-S1 DECOMPRESSION, L4-S1 INSTRUMENTED POSTERIOR SPINAL FUSION;  Surgeon: Melina Schools, MD;  Location: [facility];  Service: Orthopedics;  Laterality: N/A;    MEDICATIONS: Current Outpatient Prescriptions on File Prior to Visit  Medication Sig Dispense Refill  . cholecalciferol (VITAMIN D) 1000 UNITS tablet Take 1,000 Units by mouth daily as needed (for vitamin).    Marland Kitchen lisinopril-hydrochlorothiazide (PRINZIDE,ZESTORETIC) 10-12.5 MG per tablet Take 1 tablet by mouth daily.    . vitamin  B-12 (CYANOCOBALAMIN) 1000 MCG tablet Take 1,000 mcg by mouth daily as needed (for vitamin).     No current facility-administered medications on file prior to visit.    ALLERGIES: No Known Allergies  FAMILY HISTORY: No family history on file.  SOCIAL HISTORY: History   Social History  . Marital Status: Divorced     Spouse Name: N/A    Number of Children: N/A  . Years of Education: N/A   Occupational History  . Not on file.   Social History Main Topics  . Smoking status: Never Smoker   . Smokeless tobacco: Never Used  . Alcohol Use: 0.0 oz/week    0 Not specified per week     Comment: very rare  . Drug Use: No  . Sexual Activity: No   Other Topics Concern  . Not on file   Social History Narrative    REVIEW OF SYSTEMS: Constitutional: No fevers, chills, or sweats, no generalized fatigue, change in appetite Eyes: No visual changes, double vision, eye pain Ear, nose and throat: No hearing loss, ear pain, nasal congestion, sore throat Cardiovascular: No chest pain, palpitations Respiratory:  + shortness of breath with exertion,no wheezes GastrointestinaI: No nausea, vomiting, diarrhea, abdominal pain, fecal incontinence Genitourinary:  No dysuria, urinary retention or frequency Musculoskeletal:  + neck pain, back pain Integumentary: No rash, pruritus, skin lesions Neurological: as above Psychiatric: No depression, insomnia, anxiety Endocrine: No palpitations, fatigue, +diaphoresis,no mood swings, change in appetite, change in weight, increased thirst Hematologic/Lymphatic:  No anemia, purpura, petechiae. Allergic/Immunologic: no itchy/runny eyes, nasal congestion, recent allergic reactions, rashes  PHYSICAL EXAM: Filed Vitals:   06/29/14 0929  BP: 128/88  Pulse: 85  Resp: 16   General: No acute distress Head:  Normocephalic/atraumatic Eyes: Fundoscopic exam shows bilateral sharp discs, no vessel changes, exudates, or hemorrhages Neck: supple, no paraspinal tenderness, full range of motion Back: No paraspinal tenderness Heart: regular rate and rhythm Lungs: Clear to auscultation bilaterally. Vascular: No carotid bruits. Intact dorsalis pedis pulse bilaterally Skin/Extremities: No rash, +1 bipedal edema with hyperpigmentation in both calves Neurological Exam: Mental status: alert  and oriented to person, place, and time, no dysarthria or aphasia, Fund of knowledge is appropriate.  Recent and remote memory are intact.  Attention and concentration are normal.    Able to name objects and repeat phrases. Cranial nerves: CN I: not tested CN II: pupils equal, round and reactive to light, visual fields intact, fundi unremarkable. CN III, IV, VI:  full range of motion, no nystagmus, no ptosis CN V: facial sensation intact CN VII: upper and lower face symmetric CN VIII: hearing intact to finger rub CN IX, X: gag intact, uvula midline CN XI: sternocleidomastoid and trapezius muscles intact CN XII: tongue midline Bulk & Tone: normal, no fasciculations. Motor: 5/5 throughout with no pronator drift. Sensation: intact to light touch, cold, pin, vibration and joint position sense.  No extinction to double simultaneous stimulation.  Romberg test negative Deep Tendon Reflexes: +2 throughout except for absent right knee jerk and +1 left ankle jerk, no ankle clonus, negative Hoffman sign Plantar responses: downgoing bilaterally Cerebellar: no incoordination on finger to nose, heel to shin. No dysdiadochokinesia Gait: narrow-based and steady, able to tandem walk adequately. Tremor: none  IMPRESSION: This is a pleasant 73 year old right-handed woman with a history of hypertension, hyperlipidemia, spinal stenosis s/p L3-S1 decompression, L4-S1 posterior spinal fusion 04/2013, presenting for continued symptoms of bilateral leg fatigue despite back surgery. Symptoms suggestive of claudication, vascular versus neurogenic,  however she does report similar but milder symptoms in proximal upper extremities. Records from Dr. Rolena Infante have been requested for review. She will be scheduled for ankle-brachial index and EMG/NCV of the upper and lower extremity to further evaluate her symptoms. Bloodwork for CMP, CK, aldolase, ESR, ANA, and RF will be ordered. She will follow-up after the tests.  Thank you  for allowing me to participate in the care of this patient. Please do not hesitate to call for any questions or concerns.   Ellouise Newer, M.D.  CC: Dr. Justin Mend

## 2014-06-29 NOTE — Patient Instructions (Addendum)
1. Bloodwork for CMP, CK, aldolase, ESR, ANA, RF 2. Schedule EMG/NCV of right UE and LE with Dr. Posey Pronto 3. Schedule ankle-brachial index (ABI) test 4. We will obtain records from Dr. Rolena Infante (Ortho) 5. Follow-up in 2 months

## 2014-06-30 ENCOUNTER — Encounter: Payer: Self-pay | Admitting: Neurology

## 2014-06-30 DIAGNOSIS — E785 Hyperlipidemia, unspecified: Secondary | ICD-10-CM | POA: Insufficient documentation

## 2014-06-30 DIAGNOSIS — M48062 Spinal stenosis, lumbar region with neurogenic claudication: Secondary | ICD-10-CM | POA: Insufficient documentation

## 2014-06-30 DIAGNOSIS — M6281 Muscle weakness (generalized): Secondary | ICD-10-CM | POA: Insufficient documentation

## 2014-06-30 DIAGNOSIS — I10 Essential (primary) hypertension: Secondary | ICD-10-CM | POA: Insufficient documentation

## 2014-06-30 LAB — ANA: Anti Nuclear Antibody(ANA): NEGATIVE

## 2014-07-03 LAB — ALDOLASE: ALDOLASE: 5 U/L (ref <=8.1)

## 2014-07-05 ENCOUNTER — Other Ambulatory Visit (HOSPITAL_COMMUNITY): Payer: Self-pay | Admitting: Neurology

## 2014-07-05 ENCOUNTER — Ambulatory Visit (HOSPITAL_COMMUNITY)
Admission: RE | Admit: 2014-07-05 | Discharge: 2014-07-05 | Disposition: A | Discharge status: Home / Self Care | Payer: Medicare PPO | Source: Ambulatory Visit | Attending: Neurology | Admitting: Neurology

## 2014-07-05 DIAGNOSIS — M6281 Muscle weakness (generalized): Secondary | ICD-10-CM | POA: Insufficient documentation

## 2014-07-05 NOTE — Progress Notes (Signed)
VASCULAR LAB PRELIMINARY  ARTERIAL  ABI completed:    RIGHT    LEFT    PRESSURE WAVEFORM  PRESSURE WAVEFORM  BRACHIAL 188 Triphasic BRACHIAL 155 Triphasic  DP 151 Triphasic DP 148 Triphasic  AT   AT    PT 161 Triphasic PT 158 Triphasic  PER   PER    GREAT TOE  NA GREAT TOE  NA    RIGHT LEFT  ABI 0.86 0.84    Bilateral ABIs are suggestive of mild arterial insufficiency, however, triphasic pedal waveforms do not support ABI classification.  There is a 64mmHg difference between the right and left brachial artery pressures, suggestive of a possible steal syndrome.  07/05/2014 1:56 PM Maudry Mayhew, RVT, RDCS, RDMS

## 2014-07-08 ENCOUNTER — Other Ambulatory Visit: Payer: Self-pay | Admitting: Family Medicine

## 2014-07-08 DIAGNOSIS — I771 Stricture of artery: Secondary | ICD-10-CM

## 2014-07-14 ENCOUNTER — Encounter: Payer: Medicare PPO | Admitting: Vascular Surgery

## 2014-07-14 ENCOUNTER — Other Ambulatory Visit (HOSPITAL_COMMUNITY): Payer: Medicare PPO

## 2014-07-20 ENCOUNTER — Other Ambulatory Visit: Payer: Self-pay | Admitting: *Deleted

## 2014-07-20 DIAGNOSIS — M79604 Pain in right leg: Secondary | ICD-10-CM

## 2014-07-20 DIAGNOSIS — M79603 Pain in arm, unspecified: Secondary | ICD-10-CM

## 2014-07-20 DIAGNOSIS — M79605 Pain in left leg: Principal | ICD-10-CM

## 2014-07-20 DIAGNOSIS — I739 Peripheral vascular disease, unspecified: Secondary | ICD-10-CM

## 2014-08-04 ENCOUNTER — Telehealth: Payer: Self-pay | Admitting: *Deleted

## 2014-08-04 NOTE — Telephone Encounter (Signed)
Called patient to move her emg to 08/09/2013

## 2014-08-09 ENCOUNTER — Ambulatory Visit (INDEPENDENT_AMBULATORY_CARE_PROVIDER_SITE_OTHER): Payer: Medicare PPO | Admitting: Neurology

## 2014-08-09 DIAGNOSIS — M5412 Radiculopathy, cervical region: Secondary | ICD-10-CM

## 2014-08-09 DIAGNOSIS — G5601 Carpal tunnel syndrome, right upper limb: Secondary | ICD-10-CM

## 2014-08-09 DIAGNOSIS — M5417 Radiculopathy, lumbosacral region: Secondary | ICD-10-CM

## 2014-08-09 NOTE — Procedures (Signed)
[facility]  [place] [address], [address]  [place], [place] [address] Tel: [phone] Fax:  [phone] Test Date:  08/09/2014  Patient: Sharon Fitzgerald DOB: June 17, 1941 Physician: Narda Amber, DO  Sex: Female Height: 5\' 3"  Ref Phys: Ellouise Newer  ID#: [id number] Temp: 32.0C Technician: Laureen Ochs R. NCS T.   Patient Complaints: Patient is a 74 year old female here for evaluation of her continued leg fatigue s/p spine surgery.  NCV & EMG Findings: Extensive electrodes testing of the right upper and lower extremity shows:  1. Right median, ulnar, and radial sensory responses are within normal limits. The right palmar sensory responses are abnormal. 2. Right median and ulnar motor responses are within normal limits. 3. Right sural and superficial peroneal sensory responses are within normal limits. 4. Right peroneal is within normal limits. Tibial motor response at the ankle is within normal limits, however when stimulating at the popliteal fossa, amplitude is reduced. These findings are technical in nature due to patient's physiognomy.  5. In the upper extremity, chronic motor axon loss changes are seen affecting the first dorsal interosseous, extensor indicis proprius, and triceps muscles, without accompanied active denervation. 6. In the lower extremity, chronic motor axon loss changes are seen affecting L5-S1 myotomes, without accompanied active denervation  Impression: 1. 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 Overall, these findings are mild in degree electrically. 2. Chronic C8 radiculopathy affecting the right upper extremity; very mild in degree electrically. 3. Chronic L5-S1 radiculopathy affecting the right lower extremity; mild in degree electrically.   ___________________________ Narda Amber, DO    Nerve Conduction Studies Anti Sensory Summary Table   Site NR Peak (ms) Norm Peak (ms)  P-T Amp (V) Norm P-T Amp  Right Median Anti Sensory (2nd Digit)  32C  Wrist    3.5 <3.8 32.5 >10  Right Radial Anti Sensory (Base 1st Digit)  32C  Wrist    2.2 <2.8 37.8 >10  Right Sup Peroneal Anti Sensory (Ant Lat Mall)  32C  12 cm    2.3 <4.6 7.8 >3  Right Sural Anti Sensory (Lat Mall)  Calf    3.6 <4.6 5.0 >3  Right Ulnar Anti Sensory (5th Digit)  32C  Wrist    2.9 <3.2 34.7 >5   Motor Summary Table   Site NR Onset (ms) Norm Onset (ms) O-P Amp (mV) Norm O-P Amp Site1 Site2 Delta-0 (ms) Dist (cm) Vel (m/s) Norm Vel (m/s)  Right Median Motor (Abd Poll Brev)  32C  Wrist    3.3 <4.0 10.8 >5 Elbow Wrist 4.5 26.0 58 >50  Elbow    7.8  10.5         Right Peroneal Motor (Ext Dig Brev)  32C  Ankle    3.7 <6.0 3.7 >2.5 B Fib Ankle 6.5 32.0 49 >40  B Fib    10.2  3.6  Poplt B Fib 1.6 8.0 50 >40  Poplt    11.8  3.6         Right Peroneal TA Motor (Tib Ant)  32C  Fib Head    2.3 <4.5 5.6 >3 Poplit Fib Head 1.8 11.0 61 >40  Poplit    4.1  4.1         Right Tibial Motor (Abd Hall Brev)  32C    body habitus behind knee  Ankle    3.8 <6.0 13.7 >4 Knee Ankle 8.2 39.0 48 >40  Knee    12.0  4.1         Right Ulnar Motor (Abd Dig Minimi)  32C  Wrist    2.2 <3.1 7.1 >7 B Elbow Wrist 3.6 24.0 67 >50  B Elbow    5.8  6.5  A Elbow B Elbow 1.5 10.0 67 >50  A Elbow    7.3  6.2          Comparison Summary Table   Site NR Peak (ms) Norm Peak (ms) P-T Amp (V) Site1 Site2 Delta-P (ms) Norm Delta (ms)  Right Median/Ulnar Palm Comparison (Wrist - 8cm)  31C  Median Palm    2.0 <2.2 90.6 Median Palm Ulnar Palm 0.5   Ulnar Palm    1.5 <2.2 11.4       EMG   Side Muscle Ins Act Fibs Psw Fasc Number Recrt Dur Dur. Amp Amp. Poly Poly. Comment  Right 1stDorInt Nml Nml Nml Nml 1- Mod-R Few 1+ Few 1+ Nml Nml N/A  Right Abd Poll Brev Nml Nml Nml Nml Nml Nml Nml Nml Nml Nml Nml Nml N/A  Right FlexPolLong Nml Nml Nml Nml Nml Nml Nml Nml Nml Nml Nml Nml N/A  Right Ext Indicis Nml Nml Nml Nml 1- Mod-R  Few 1+ Few 1+ Nml Nml N/A  Right PronatorTeres Nml Nml Nml Nml Nml Nml Nml Nml Nml Nml Nml Nml N/A  Right Triceps Nml Nml Nml Nml 1- Mod Few 1+ Nml Nml Nml Nml N/A  Right Biceps Nml Nml Nml Nml Nml Nml Nml Nml Nml Nml Nml Nml N/A  Right AntTibialis Nml Nml Nml Nml 1- Mod-R Some 1+ Few 1+ Nml Nml N/A  Right Gastroc Nml Nml Nml Nml 1- Mod-R Some 1+ Few 1+ Nml Nml N/A  Right Flex Dig Long Nml Nml Nml Nml 1- Mod-R Few 1+ Few 1+ Nml Nml N/A  Right RectFemoris Nml Nml Nml Nml Nml Nml Nml Nml Nml Nml Nml Nml N/A  Right GluteusMed Nml Nml Nml Nml 1- Mod-R Few 1+ Nml Nml Nml Nml N/A      Waveforms:

## 2014-08-12 ENCOUNTER — Telehealth: Payer: Self-pay | Admitting: Neurology

## 2014-08-12 NOTE — Telephone Encounter (Signed)
Discussed EMG results, mild lumbar radic and cervical radic, would not clearly explain leg fatigue. Would proceed with vascular surgery visit on Monday. She also has carpal tunnel on EMG, recommended wrist splint. Pt has f/u in Feb.

## 2014-08-13 ENCOUNTER — Encounter: Payer: Self-pay | Admitting: Surgery

## 2014-08-16 ENCOUNTER — Encounter: Payer: Self-pay | Admitting: Surgery

## 2014-08-16 ENCOUNTER — Ambulatory Visit (INDEPENDENT_AMBULATORY_CARE_PROVIDER_SITE_OTHER): Payer: Medicare PPO | Admitting: Surgery

## 2014-08-16 ENCOUNTER — Ambulatory Visit (HOSPITAL_COMMUNITY)
Admission: RE | Admit: 2014-08-16 | Discharge: 2014-08-16 | Disposition: A | Discharge status: Home / Self Care | Payer: Medicare PPO | Source: Ambulatory Visit | Attending: Surgery | Admitting: Surgery

## 2014-08-16 ENCOUNTER — Ambulatory Visit (INDEPENDENT_AMBULATORY_CARE_PROVIDER_SITE_OTHER)
Admission: RE | Admit: 2014-08-16 | Discharge: 2014-08-16 | Disposition: A | Discharge status: Home / Self Care | Payer: Medicare PPO | Source: Ambulatory Visit | Attending: Surgery | Admitting: Surgery

## 2014-08-16 VITALS — BP 131/84 | HR 87 | Ht 63.5 in | Wt 192.3 lb

## 2014-08-16 DIAGNOSIS — M79604 Pain in right leg: Secondary | ICD-10-CM

## 2014-08-16 DIAGNOSIS — I739 Peripheral vascular disease, unspecified: Secondary | ICD-10-CM

## 2014-08-16 DIAGNOSIS — M79605 Pain in left leg: Secondary | ICD-10-CM

## 2014-08-16 DIAGNOSIS — I70213 Atherosclerosis of native arteries of extremities with intermittent claudication, bilateral legs: Secondary | ICD-10-CM | POA: Insufficient documentation

## 2014-08-16 DIAGNOSIS — M79603 Pain in arm, unspecified: Secondary | ICD-10-CM

## 2014-08-16 DIAGNOSIS — R29898 Other symptoms and signs involving the musculoskeletal system: Secondary | ICD-10-CM

## 2014-08-16 NOTE — Progress Notes (Signed)
Consult  HISTORY AND PHYSICAL   History of Present Illness:  Patient is a 74 y.o. year old female who presents for evaluation of Bilateral lower extremities with weakness.  Her symptoms have been present over the last 18 months.  She states she gets tired and has to stop and rest.  It feels like muscle fatigue when she is walking, but she can sit and ride a bike without fatigue for 30 min.  She has Lumbar posterior fusion 18 months ago that did relieve her lumbar pain, but she is unable to return to her daily walks of greater than 2 miles.    Her hypercholesterolemia is managed with a statin.  Her blood pressure is managed with a ACE inhibitor.  Past Medical History  Diagnosis Date  . Hypertension   . Arthritis   . Spinal stenosis     sees Dr. Rolena Infante    Past Surgical History  Procedure Laterality Date  . Tubal ligation    . Left hand surgury from fracture    . Breast surgery Bilateral     benign fibroid cyst  . Total knee arthroplasty Right 01/15/2013    Procedure: RIGHT TOTAL KNEE ARTHROPLASTY;  Surgeon: [name], MD;  Location: [facility];  Service: Orthopedics;  Laterality: Right;  . Colonoscopy    . Lumbar laminectomy/decompression microdiscectomy N/A 04/30/2013    Procedure: L3-S1 DECOMPRESSION, L4-S1 INSTRUMENTED POSTERIOR SPINAL FUSION;  Surgeon: Melina Schools, MD;  Location: [facility];  Service: Orthopedics;  Laterality: N/A;  . Joint replacement Right     Social History History  Substance Use Topics  . Smoking status: Never Smoker   . Smokeless tobacco: Never Used  . Alcohol Use: 3.0 oz/week    5 Glasses of wine per week     Comment: very rare    Family History Family History  Problem Relation Age of Onset  . Cancer Mother   . Hypertension Mother   . Heart disease Father     Allergies  No Known Allergies   Current Outpatient Prescriptions  Medication Sig Dispense Refill  . aspirin 325 MG tablet Take 325 mg by mouth. Takes 2 daily    . cholecalciferol  (VITAMIN D) 1000 UNITS tablet Take 1,000 Units by mouth daily as needed (for vitamin).    . ferrous gluconate (FERGON) 240 (27 FE) MG tablet Take 240 mg by mouth. Takes 2 daily    . lisinopril-hydrochlorothiazide (PRINZIDE,ZESTORETIC) 10-12.5 MG per tablet Take 1 tablet by mouth daily.    . Magnesium 400 MG CAPS Take 400 mg by mouth 2 (two) times daily.    . pravastatin (PRAVACHOL) 20 MG tablet Take 20 mg by mouth. Take 1 tablet daily  11  . vitamin B-12 (CYANOCOBALAMIN) 1000 MCG tablet Take 1,000 mcg by mouth daily as needed (for vitamin).     No current facility-administered medications for this visit.    ROS:   General:  No weight loss, Fever, chills  HEENT: No recent headaches, no nasal bleeding, no visual changes, no sore throat  Neurologic: No dizziness, blackouts, seizures. No recent symptoms of stroke or mini- stroke. No recent episodes of slurred speech, or temporary blindness.  Cardiac: No recent episodes of chest pain/pressure, no shortness of breath at rest.  No shortness of breath with exertion.  Denies history of atrial fibrillation or irregular heartbeat  Vascular: No history of rest pain in feet.  No history of claudication.  No history of non-healing ulcer, No history of DVT  Pulmonary: No home oxygen, no productive cough, no hemoptysis,  No asthma or wheezing  Musculoskeletal:  [ ]  Arthritis, [ ]  Low back pain,  [ ]  Joint pain  Hematologic:No history of hypercoagulable state.  No history of easy bleeding.  No history of anemia  Gastrointestinal: No hematochezia or melena,  No gastroesophageal reflux, no trouble swallowing  Urinary: [ ]  chronic Kidney disease, [ ]  on HD - [ ]  MWF or [ ]  TTHS, [ ]  Burning with urination, [ ]  Frequent urination, [ ]  Difficulty urinating;   Skin: No rashes  Psychological: No history of anxiety,  No history of depression   Physical Examination  Filed Vitals:   08/16/14 1339  BP: 131/84  Pulse: 87  Height: 5' 3.5" (1.613 m)    Weight: 192 lb 4.8 oz (87.227 kg)  SpO2: 98%    Body mass index is 33.53 kg/(m^2).  General:  Alert and oriented, no acute distress HEENT: Normal Neck: No bruit or JVD Pulmonary: Clear to auscultation bilaterally Cardiac: Regular Rate and Rhythm without murmur Abdomen: Soft, non-tender, non-distended, no mass, no scars Skin: No rash Extremity Pulses:  2+ radial, brachial, femoral, dorsalis pedis, posterior tibial pulses bilaterally Musculoskeletal: No deformity or edema  Neurologic: Upper and lower extremity motor 5/5 and symmetric  Non invasive vascular study: ABI triphasic flow bilaterally 1.0 Aortoiliac duplex: Triphasic flow with ABI of 1.0 Carotid duplex: No stenosis bilateral <40% WNL   ASSESSMENT:   Weakness in bilateral Lower extremities with ambulation No evidence of PAD.  Her vascular studies are completely normal.  This does not seem to be her primary problem.    PLAN: She will follow up PRN with [place] in the future.    Theda Sers EMMA [facility] PA-C [facility] Office: [phone]  I agree with the above.  This is a very pleasant 74 year old female who with history of back surgery, who complains of bilateral lower extremity weakness.  She states that she feels like her legs are going to give out.  She states that her symptoms mostly occur when she stands up and begins to walk.  They appeared to get better with more activity.  Outside Doppler studies revealed an ankle-brachial index in the 0.5 range.  This was repeated today.  In our lab, she had an ankle-brachial index of 1.0 bilaterally and she had triphasic waveforms.  On examination, she had a faintly palpable left dorsalis pedis pulse.  I could not palpate a pedal pulse on the right.  I discussed with the patient that I don't think her symptoms are classic for arterial insufficiency.  Her ultrasound findings further support this.  Most likely, I feel her symptoms are neurogenic in  origin.  She is scheduled to see her neurologist in the immediate future and then her orthopedic surgeon for further evaluation.  I will have her follow up with me on a as needed basis.  Thank you for letting me participate in this patient's care.  Annamarie Major

## 2014-08-23 ENCOUNTER — Encounter: Payer: Medicare PPO | Admitting: Neurology

## 2014-09-13 ENCOUNTER — Ambulatory Visit: Payer: Medicare PPO | Admitting: Neurology

## 2014-09-27 ENCOUNTER — Ambulatory Visit (INDEPENDENT_AMBULATORY_CARE_PROVIDER_SITE_OTHER): Payer: Medicare PPO | Admitting: Neurology

## 2014-09-27 ENCOUNTER — Encounter: Payer: Self-pay | Admitting: Neurology

## 2014-09-27 VITALS — BP 104/70 | HR 84 | Ht 63.0 in | Wt 197.0 lb

## 2014-09-27 DIAGNOSIS — G5601 Carpal tunnel syndrome, right upper limb: Secondary | ICD-10-CM

## 2014-09-27 DIAGNOSIS — M6281 Muscle weakness (generalized): Secondary | ICD-10-CM

## 2014-09-27 DIAGNOSIS — M5417 Radiculopathy, lumbosacral region: Secondary | ICD-10-CM

## 2014-09-27 NOTE — Patient Instructions (Addendum)
1. Refer to physical therapy for lumbar radiculopathy and neurogenic claudication 2. Refer to Ortho Hand for carpal tunnel syndrome 3. Follow-up in 3 months, call our office for any problems   YOU HAVE BEEN SCHEDULED @ [facility] W/DR. LANDAU ON 09/30/14 @ 2:45 PM.  [address] [place], [place] [address] [phone]

## 2014-09-27 NOTE — Progress Notes (Signed)
NEUROLOGY FOLLOW UP OFFICE NOTE  ADALEI NOVELL [id number]  HISTORY OF PRESENT ILLNESS: I had the pleasure of seeing Miu Chiong in follow-up in the neurology clinic on 09/27/2014.  The patient was last seen 3 months ago for continued symptoms of bilateral leg fatigue despite back surgery. Records and images were personally reviewed where available.  She had an EMG/NCV done which showed mild right chronic L5-S1 radiculopathy. 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 No evidence of myopathy or neuromuscular junction disorder. She was advised to wear a wrist splint. She got the wrong splint for her right wrist and had more problems. She now wears a splint on the left wrist as well, and has noticed more weakness and numbness in her hand. 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 She continues to have fatigable bilateral leg weakness after walking long distances or standing more than 10 minutes. After sitting down, she feels better. No bowel/bladder dysfunction. She has kindly been evaluated by Vascular Surgery as well, no evidence of PAD, vascular studies completely normal. Symptoms were not felt to be classic for arterial insufficiency, possibly neurogenic.    Laboratory Data: Component     Latest Ref Rng 05/01/2013 06/29/2014  Sodium     135 - 145 mEq/L 137 137  Potassium     3.5 - 5.3 mEq/L 4.4 4.2  Chloride     96 - 112 mEq/L 100 104  CO2     19 - 32 mEq/L 28 25  Glucose     70 - 99 mg/dL 132 (H) 86  BUN     6 - 23 mg/dL 11 33 (H)  Creatinine     0.50 - 1.10 mg/dL 1.03 1.24 (H)  Calcium     8.4 - 10.5 mg/dL 8.9 9.5  Total Protein     6.0 - 8.3 g/dL 5.8 (L) 7.1  Albumin     3.5 - 5.2 g/dL 2.8 (L) 4.1  AST     0 - 37 U/L 35 12  ALT     0 - 35 U/L 21 <8  Alkaline Phosphatase     39 -  117 U/L 77 117  Total Bilirubin     0.2 - 1.2 mg/dL 0.3 0.4  GFR calc non Af Amer     >90 mL/min 53 (L)   GFR calc Af Amer     >90 mL/min 62 (L)   CK Total     7 - 177 U/L  58  Aldolase     <=8.1 U/L  5.0  Sed Rate     0 - 22 mm/hr  27 (H)  Anit Nuclear Antibody(ANA)     NEGATIVE  NEG  Rhuematoid fact SerPl-aCnc     <=14 IU/mL  <10   HPI: This is a pleasant 74 yo RH woman with a history of hypertension, hyperlipidemia, spinal stenosis s/p L3-S1 decompression, L4-S1 posterior spinal fusion 04/2013, who presented for continued symptoms of bilateral leg fatigue despite back surgery. She reports symptoms started 10 years ago, after walking half a block, her legs would feel as if she had been running for a long time. She stops and leans down to recover, then is able to walk another half block. She was having constant back pain prior to the surgery, and reports that pain has  significantly improved post-op, however the leg fatigue continues. She has seen Dr. Rolena Infante and follow-up imaging reported to look good, records requested for review. Being vertical or standing in line is "excruciating." When she shops, she uses the cart to lean down and provide relief. She can do exercises lying down or sitting (exercise bike) with no effort and no difficulties. 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 She has noticed similar but milder symptoms in her arms, after brushing or curling her hair, her arms would feel heavy. She has muscle cramps, particularly in her upper back, but has avoided activities that would worsen it. She denies any diplopia, dysarthria, dysphagia. No headaches, dizziness, bowel/bladder dysfunction.    PAST MEDICAL HISTORY: Past Medical History  Diagnosis Date  . Hypertension   . Arthritis   . Spinal stenosis     sees Dr. Rolena Infante    MEDICATIONS: Current Outpatient Prescriptions on File Prior to Visit  Medication Sig  Dispense Refill  . aspirin 325 MG tablet Take 325 mg by mouth. Takes 2 daily    . cholecalciferol (VITAMIN D) 1000 UNITS tablet Take 1,000 Units by mouth daily as needed (for vitamin).    . ferrous gluconate (FERGON) 240 (27 FE) MG tablet Take 240 mg by mouth. Takes 2 daily    . lisinopril-hydrochlorothiazide (PRINZIDE,ZESTORETIC) 10-12.5 MG per tablet Take 1 tablet by mouth daily.    . Magnesium 400 MG CAPS Take 400 mg by mouth 2 (two) times daily.    . pravastatin (PRAVACHOL) 20 MG tablet Take 20 mg by mouth. Take 1 tablet daily  11  . vitamin B-12 (CYANOCOBALAMIN) 1000 MCG tablet Take 1,000 mcg by mouth daily as needed (for vitamin).     No current facility-administered medications on file prior to visit.    ALLERGIES: No Known Allergies  FAMILY HISTORY: Family History  Problem Relation Age of Onset  . Cancer Mother   . Hypertension Mother   . Heart disease Father     SOCIAL HISTORY: History   Social History  . Marital Status: Divorced    Spouse Name: N/A  . Number of Children: N/A  . Years of Education: N/A   Occupational History  . Not on file.   Social History Main Topics  . Smoking status: Never Smoker   . Smokeless tobacco: Never Used  . Alcohol Use: 3.0 oz/week    5 Glasses of wine per week     Comment: very rare  . Drug Use: No  . Sexual Activity: No   Other Topics Concern  . Not on file   Social History Narrative    REVIEW OF SYSTEMS: Constitutional: No fevers, chills, or sweats, no generalized fatigue, change in appetite Eyes: No visual changes, double vision, eye pain Ear, nose and throat: No hearing loss, ear pain, nasal congestion, sore throat Cardiovascular: No chest pain, palpitations Respiratory:  No shortness of breath at rest or with exertion, wheezes GastrointestinaI: No nausea, vomiting, diarrhea, abdominal pain, fecal incontinence Genitourinary:  No dysuria, urinary retention or frequency Musculoskeletal:  No neck pain, back  pain Integumentary: No rash, pruritus, skin lesions Neurological: as above Psychiatric: No depression, insomnia, anxiety Endocrine: No palpitations, fatigue, diaphoresis, mood swings, change in appetite, change in weight, increased thirst Hematologic/Lymphatic:  No anemia, purpura, petechiae. Allergic/Immunologic: no itchy/runny eyes, nasal congestion, recent allergic reactions, rashes  PHYSICAL EXAM: Filed Vitals:   09/27/14 0943  BP: 104/70  Pulse: 84  General: No acute distress Head: Normocephalic/atraumatic Eyes: Fundoscopic exam shows bilateral sharp discs, no vessel changes, exudates, or hemorrhages Neck: supple, no paraspinal tenderness, full range of motion Back: No paraspinal tenderness Heart: regular rate and rhythm Lungs: Clear to auscultation bilaterally. Vascular: No carotid bruits. Intact dorsalis pedis pulse bilaterally Skin/Extremities: No rash, +1 bipedal edema with hyperpigmentation in both calves (similar to prior) Neurological Exam: Mental status: alert and oriented to person, place, and time, no dysarthria or aphasia, Fund of knowledge is appropriate. Recent and remote memory are intact. Attention and concentration are normal. Able to name objects and repeat phrases. Cranial nerves: CN I: not tested CN II: pupils equal, round and reactive to light, visual fields intact, fundi unremarkable. CN III, IV, VI: full range of motion, no nystagmus, no ptosis CN V: facial sensation intact CN VII: upper and lower face symmetric CN VIII: hearing intact to finger rub CN IX, X: gag intact, uvula midline CN XI: sternocleidomastoid and trapezius muscles intact CN XII: tongue midline Bulk & Tone: normal, no fasciculations. Motor: 5/5 throughout with no pronator drift. Sensation: intact to light touch. No extinction to double simultaneous stimulation. Romberg test negative Deep Tendon Reflexes: +2 throughout except for absent right knee jerk and +1 left ankle  jerk Plantar responses: downgoing bilaterally Cerebellar: no incoordination on finger to nose, heel to shin. No dysdiadochokinesia Gait: narrow-based and steady, able to tandem walk adequately. Tremor: none  IMPRESSION: This is a pleasant 74 yo RH woman with a history of hypertension, hyperlipidemia, spinal stenosis s/p L3-S1 decompression, L4-S1 posterior spinal fusion 04/2013, with continued symptoms of bilateral leg fatigue despite back surgery. Symptoms suggestive of claudication, vascular workup has been normal, felt to be neurogenic. EMG/NCV showed mild right chronic L5-S1 radiculopathy. We discussed symptomatic treatment with physical therapy, which she is agreeable to doing. She would like a referral to Ortho hand specialist for the carpal tunnel syndrome and consideration for release. She will also discuss the hand/knee arthritis with Ortho. She will follow-up in 3 months.  Thank you for allowing me to participate in her care.  Please do not hesitate to call for any questions or concerns.  The duration of this appointment visit was 15 minutes of face-to-face time with the patient.  Greater than 50% of this time was spent in counseling, explanation of diagnosis, planning of further management, and coordination of care.   Ellouise Newer, M.D.   CC: Dr. Justin Mend

## 2014-09-30 ENCOUNTER — Encounter: Payer: Self-pay | Admitting: Neurology

## 2014-12-20 ENCOUNTER — Encounter: Payer: Self-pay | Admitting: Neurology

## 2014-12-20 ENCOUNTER — Ambulatory Visit (INDEPENDENT_AMBULATORY_CARE_PROVIDER_SITE_OTHER): Payer: Medicare PPO | Admitting: Neurology

## 2014-12-20 VITALS — BP 130/80 | HR 84 | Resp 16 | Ht 63.0 in | Wt 196.0 lb

## 2014-12-20 DIAGNOSIS — G5601 Carpal tunnel syndrome, right upper limb: Secondary | ICD-10-CM | POA: Diagnosis not present

## 2014-12-20 DIAGNOSIS — M5417 Radiculopathy, lumbosacral region: Secondary | ICD-10-CM | POA: Diagnosis not present

## 2014-12-20 NOTE — Patient Instructions (Signed)
1. Proceed with exercise program as planned 2. If symptoms unchanged or worsen, we will proceed with physical therapy, call our office at that point and we will set it up for you 3. Follow-up in 6 months, call our office for any changes

## 2014-12-20 NOTE — Progress Notes (Signed)
NEUROLOGY FOLLOW UP OFFICE NOTE  Sharon Fitzgerald [id number]  HISTORY OF PRESENT ILLNESS: I had the pleasure of seeing Sharon Fitzgerald in follow-up in the neurology clinic on 12/20/2014.  The patient was last seen 3 months ago for bilateral leg fatigue despite back surgery. EMG/NCV showed mild right chronic L5-S1 radiculopathy. She also had mild right median neuropathy at the wrist, as well as very mild chronic right C8 radiculopathy. No evidence of myopathy or neuromuscular junction disorder. Vascular has evaluated her, symptoms not felt to be classic for arterial insufficiency, possibly neurogenic.  Since her last visit, she reports leg symptoms are unchanged. She continues to have fatigue and back pain after standing for a prolonged period, this improves after sitting down. She denies any paresthesias or bowel/bladder dysfunction. She had an injection in the left wrist, which briefly helped, however pain has returned. She denies any hand weakness but is careful not to drop things due to pain. She denies any diplopia, dysarthria, dysphagia, headaches, dizziness.  HPI: This is a pleasant 74 yo RH woman with a history of hypertension, hyperlipidemia, spinal stenosis s/p L3-S1 decompression, L4-S1 posterior spinal fusion 04/2013, who presented for continued symptoms of bilateral leg fatigue despite back surgery. She reports symptoms started 10 years ago, after walking half a block, her legs would feel as if she had been running for a long time. She stops and leans down to recover, then is able to walk another half block. She was having constant back pain prior to the surgery, and reports that pain has significantly improved post-op, however the leg fatigue continues. She has seen Dr. Rolena Infante and follow-up imaging reported to look good. Being vertical or standing in line is "excruciating." When she shops, she uses the cart to lean down and provide relief. She can do exercises lying down or sitting (exercise  bike) with no effort and no difficulties. 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 She has noticed similar but milder symptoms in her arms, after brushing or curling her hair, her arms would feel heavy. She has muscle cramps, particularly in her upper back, but has avoided activities that would worsen it.   Diagnostic Data: EMG/NCV done which showed mild right chronic L5-S1 radiculopathy. 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 No evidence of myopathy or neuromuscular junction disorder. She has kindly been evaluated by Vascular Surgery as well, no evidence of PAD, vascular studies completely normal. Symptoms were not felt to be classic for arterial insufficiency, possibly neurogenic.    PAST MEDICAL HISTORY: Past Medical History  Diagnosis Date  . Hypertension   . Arthritis   . Spinal stenosis     sees Dr. Rolena Infante    MEDICATIONS: Current Outpatient Prescriptions on File Prior to Visit  Medication Sig Dispense Refill  . aspirin 325 MG tablet Take 325 mg by mouth. Takes 2 daily    . ferrous gluconate (FERGON) 240 (27 FE) MG tablet Take 240 mg by mouth. Takes 2 daily    . lisinopril-hydrochlorothiazide (PRINZIDE,ZESTORETIC) 10-12.5 MG per tablet Take 1 tablet by mouth daily.    . Magnesium 400 MG CAPS Take 400 mg by mouth 2 (two) times daily.    Marland Kitchen OVER THE COUNTER MEDICATION daily. Tumeric    . pravastatin (PRAVACHOL) 20 MG tablet  Take 20 mg by mouth. Take 1 tablet daily  11   No current facility-administered medications on file prior to visit.    ALLERGIES: No Known Allergies  FAMILY HISTORY: Family History  Problem Relation Age of Onset  . Cancer Mother   . Hypertension Mother   . Heart disease Father     SOCIAL HISTORY: History   Social  History  . Marital Status: Divorced    Spouse Name: N/A  . Number of Children: N/A  . Years of Education: N/A   Occupational History  . Not on file.   Social History Main Topics  . Smoking status: Never Smoker   . Smokeless tobacco: Never Used  . Alcohol Use: 3.0 oz/week    5 Glasses of wine per week     Comment: very rare  . Drug Use: No  . Sexual Activity: No   Other Topics Concern  . Not on file   Social History Narrative    REVIEW OF SYSTEMS: Constitutional: No fevers, chills, or sweats, no generalized fatigue, change in appetite Eyes: No visual changes, double vision, eye pain Ear, nose and throat: No hearing loss, ear pain, nasal congestion, sore throat Cardiovascular: No chest pain, palpitations Respiratory:  No shortness of breath at rest or with exertion, wheezes GastrointestinaI: No nausea, vomiting, diarrhea, abdominal pain, fecal incontinence Genitourinary:  No dysuria, urinary retention or frequency Musculoskeletal:  No neck pain, +back pain Integumentary: No rash, pruritus, skin lesions Neurological: as above Psychiatric: No depression, insomnia, anxiety Endocrine: No palpitations, fatigue, diaphoresis, mood swings, change in appetite, change in weight, increased thirst Hematologic/Lymphatic:  No anemia, purpura, petechiae. Allergic/Immunologic: no itchy/runny eyes, nasal congestion, recent allergic reactions, rashes  PHYSICAL EXAM: Filed Vitals:   12/20/14 1000  BP: 130/80  Pulse: 84  Resp: 16   General: No acute distress Head: Normocephalic/atraumatic Eyes: Fundoscopic exam shows bilateral sharp discs, no vessel changes, exudates, or hemorrhages Neck: supple, no paraspinal tenderness, full range of motion Back: No paraspinal tenderness Heart: regular rate and rhythm Lungs: Clear to auscultation bilaterally. Vascular: No carotid bruits. Intact dorsalis pedis pulse bilaterally Skin/Extremities: No rash Neurological Exam: Mental status: alert and  oriented to person, place, and time, no dysarthria or aphasia, Fund of knowledge is appropriate. Recent and remote memory are intact. Attention and concentration are normal. Able to name objects and repeat phrases. Cranial nerves: CN I: not tested CN II: pupils equal, round and reactive to light, visual fields intact, fundi unremarkable. CN III, IV, VI: full range of motion, no nystagmus, no ptosis CN V: facial sensation intact CN VII: upper and lower face symmetric CN VIII: hearing intact to finger rub CN IX, X: gag intact, uvula midline CN XI: sternocleidomastoid and trapezius muscles intact CN XII: tongue midline Bulk & Tone: normal, no fasciculations. Motor: 5/5 throughout with no pronator drift. Sensation: intact to light touch. No extinction to double simultaneous stimulation. Romberg test negative Deep Tendon Reflexes: +2 throughout except for absent right knee jerk and +1 left ankle jerk (similar to prior) Plantar responses: downgoing bilaterally Cerebellar: no incoordination on finger to nose testing Gait: narrow-based and steady, able to tandem walk adequately. Tremor: none  IMPRESSION: This is a pleasant 74 yo RH woman with a history of hypertension, hyperlipidemia, spinal stenosis s/p L3-S1 decompression, L4-S1 posterior spinal fusion 04/2013, with continued symptoms of bilateral leg fatigue despite back surgery. Symptoms suggestive of claudication, vascular workup has been normal, felt to be neurogenic. EMG/NCV showed mild right chronic L5-S1 radiculopathy. We had  discussed symptomatic treatment with physical therapy, which she has not yet done. She would like [facility] and [facility]. If symptoms continue, she knows to call us to refer her for PT. She will continue follow-up with Ortho for left carpal tunnel syndrome. She will follow-up in 6 months and knows to call our office for any changes.   Thank you for allowing me to  participate in her care.  Please do not hesitate to call for any questions or concerns.  The duration of this appointment visit was 15 minutes of face-to-face time with the patient.  Greater than 50% of this time was spent in counseling, explanation of diagnosis, planning of further management, and coordination of care.   Ellouise Newer, M.D.   CC: Dr. Justin Mend

## 2015-05-15 IMAGING — RF DG C-ARM GT 120 MIN
1 series · 1 of 1 positions shown · non-contrast
Comparison: Intraoperative lumbar spine localization radiographic
images - earlier same day; lumbar spine radiographs - 04/24/2013

CLINICAL DATA: L4 - S1 fusion and decompression

DG C-ARM GT 120 MIN,LUMBAR SPINE - 2-3 VIEW

[Series 1: run · 1 of 1 slices shown]
[im 1/1]
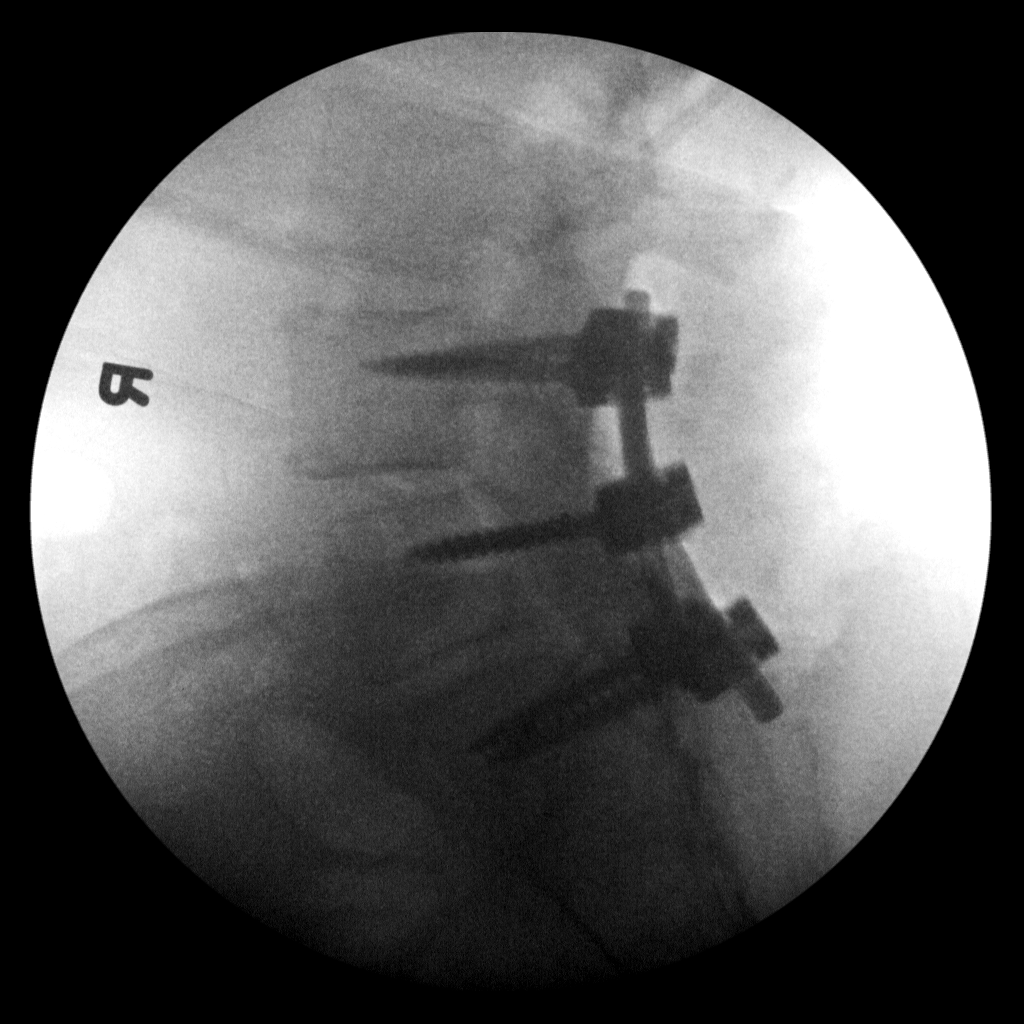

[1 of 1 positions shown; findings below may reference images not displayed]

FINDINGS: Two spot intraoperative fluoroscopic images of the lower lumbar
spine provided for review.

Lumbar spine labeling is in keeping with intraoperative lumbar
spine localization radiograph performed earlier same day.

Images demonstrate the sequela of L4 - S1 paraspinal fusion.  No
evidence of hardware failure or loosening.  No radiopaque foreign
body.
IMPRESSION: Post L4 - S1 paraspinal fusion.

## 2015-06-27 ENCOUNTER — Ambulatory Visit: Payer: Medicare PPO | Admitting: Neurology

## 2015-07-11 DIAGNOSIS — R739 Hyperglycemia, unspecified: Secondary | ICD-10-CM | POA: Diagnosis not present

## 2015-07-11 DIAGNOSIS — D509 Iron deficiency anemia, unspecified: Secondary | ICD-10-CM | POA: Diagnosis not present

## 2015-07-11 DIAGNOSIS — M4800 Spinal stenosis, site unspecified: Secondary | ICD-10-CM | POA: Diagnosis not present

## 2015-07-11 DIAGNOSIS — Z0000 Encounter for general adult medical examination without abnormal findings: Secondary | ICD-10-CM | POA: Diagnosis not present

## 2015-07-11 DIAGNOSIS — N183 Chronic kidney disease, stage 3 (moderate): Secondary | ICD-10-CM | POA: Diagnosis not present

## 2015-07-11 DIAGNOSIS — I10 Essential (primary) hypertension: Secondary | ICD-10-CM | POA: Diagnosis not present

## 2015-07-11 DIAGNOSIS — E7800 Pure hypercholesterolemia, unspecified: Secondary | ICD-10-CM | POA: Diagnosis not present

## 2018-12-24 NOTE — Progress Notes (Signed)
Please place orders in Epic as patient is being scheduled for a pre-op appointment! Patient DOB-[id number], surgery-01/13/2019. Thank you!

## 2019-01-07 NOTE — H&P (Signed)
TOTAL HIP ADMISSION H&P  Patient is admitted for right total hip arthroplasty, anterior approach.  Subjective:  Chief Complaint:   Right hip primary OA / pain  HPI: Sharon Fitzgerald, 78 y.o. female, has a history of pain and functional disability in the right hip due to arthritis and patient has failed non-surgical conservative treatments for greater than 12 weeks to include NSAID's and/or analgesics, corticosteriod injections, use of assistive devices and activity modification.  Onset of symptoms was gradual starting >10 years ago with gradually worsening course since that time.The patient noted no past surgery on the right hip(s).  Patient currently rates pain in the right hip at 8 out of 10 with activity. Patient has night pain, worsening of pain with activity and weight bearing, trendelenberg gait, pain that interfers with activities of daily living and pain with passive range of motion. Patient has evidence of periarticular osteophytes and joint space narrowing by imaging studies. This condition presents safety issues increasing the risk of falls.  There is no current active infection.  Risks, benefits and expectations were discussed with the patient.  Risks including but not limited to the risk of anesthesia, blood clots, nerve damage, blood vessel damage, failure of the prosthesis, infection and up to and including death.  Patient understand the risks, benefits and expectations and wishes to proceed with surgery.   PCP: Maurice Small, MD  D/C Plans:       Home  Post-op Meds:       No Rx given   Tranexamic Acid:      To be given - IV   Decadron:      Is to be given  FYI:      ASA  [employer]  DME:   Pt already has equipment  PT:   No PT  Pharmacy:    [facility]   Patient Active Problem List   Diagnosis Date Noted  . Lumbosacral radiculopathy 12/20/2014  . Carpal tunnel syndrome of right wrist 12/20/2014  . Muscle weakness 06/30/2014  . Spinal stenosis,  lumbar region, with neurogenic claudication 06/30/2014  . Essential hypertension 06/30/2014  . Hyperlipidemia 06/30/2014  . Expected blood loss anemia 01/16/2013  . Obese 01/16/2013  . S/P right TKA 01/15/2013   Past Medical History:  Diagnosis Date  . Arthritis   . Hypertension   . Spinal stenosis    sees Dr. Rolena Infante    Past Surgical History:  Procedure Laterality Date  . BREAST SURGERY Bilateral    benign fibroid cyst  . COLONOSCOPY    . JOINT REPLACEMENT Right   . left hand surgury from fracture    . LUMBAR LAMINECTOMY/DECOMPRESSION MICRODISCECTOMY N/A 04/30/2013   Procedure: L3-S1 DECOMPRESSION, L4-S1 INSTRUMENTED POSTERIOR SPINAL FUSION;  Surgeon: Melina Schools, MD;  Location: [facility];  Service: Orthopedics;  Laterality: N/A;  . TOTAL KNEE ARTHROPLASTY Right 01/15/2013   Procedure: RIGHT TOTAL KNEE ARTHROPLASTY;  Surgeon: [name], MD;  Location[facility];  Service: Orthopedics;  Laterality: Right;  . TUBAL LIGATION      No current facility-administered medications for this encounter.    Current Outpatient Medications  Medication Sig Dispense Refill Last Dose  . aspirin 325 MG tablet Take 650 mg by mouth every morning.    Taking  . Cholecalciferol (VITAMIN D3) 2000 UNITS TABS Take 2,000 Units by mouth daily.   Taking  . Cyanocobalamin (VITAMIN B12 PO) Take 3,000 mcg by mouth daily.    Taking  . ferrous sulfate 325 (  65 FE) MG tablet Take 650 mg by mouth daily with breakfast.     . lisinopril-hydrochlorothiazide (PRINZIDE,ZESTORETIC) 10-12.5 MG per tablet Take 1 tablet by mouth daily.   Taking  . pravastatin (PRAVACHOL) 20 MG tablet Take 20 mg by mouth daily.   11 Taking  . traMADol (ULTRAM) 50 MG tablet Take 50 mg by mouth every 6 (six) hours as needed for moderate pain.      No Known Allergies  Social History   Tobacco Use  . Smoking status: Never Smoker  . Smokeless tobacco: Never Used  Substance Use Topics  . Alcohol use: Yes    Alcohol/week: 5.0 standard drinks     Types: 5 Glasses of wine per week    Comment: very rare    Family History  Problem Relation Age of Onset  . Cancer Mother   . Hypertension Mother   . Heart disease Father      Review of Systems  Constitutional: Negative.   HENT: Negative.   Eyes: Negative.   Respiratory: Negative.   Cardiovascular: Negative.   Gastrointestinal: Negative.   Genitourinary: Negative.   Musculoskeletal: Positive for back pain and joint pain.  Skin: Negative.   Neurological: Negative.   Endo/Heme/Allergies: Negative.   Psychiatric/Behavioral: Negative.     Objective:  Physical Exam  Constitutional: She is oriented to person, place, and time. She appears well-developed.  HENT:  Head: Normocephalic.  Eyes: Pupils are equal, round, and reactive to light.  Neck: Neck supple. No JVD present. No tracheal deviation present. No thyromegaly present.  Cardiovascular: Normal rate, regular rhythm and intact distal pulses.  Respiratory: Effort normal and breath sounds normal. No respiratory distress. She has no wheezes.  GI: Soft. There is no abdominal tenderness. There is no guarding.  Musculoskeletal:     Right hip: She exhibits decreased range of motion, decreased strength, tenderness and bony tenderness. She exhibits no swelling, no deformity and no laceration.  Lymphadenopathy:    She has no cervical adenopathy.  Neurological: She is alert and oriented to person, place, and time.  Skin: Skin is warm and dry.  Psychiatric: She has a normal mood and affect.     Labs:  Estimated body mass index is 34.72 kg/m as calculated from the following:   Height as of 12/20/14: 5\' 3"  (1.6 m).   Weight as of 12/20/14: 88.9 kg.   Imaging Review Plain radiographs demonstrate severe degenerative joint disease of the right hip(s). The bone quality appears to be good for age and reported activity level.      Assessment/Plan:  End stage arthritis, right hip(s)  The patient history, physical  examination, clinical judgement of the provider and imaging studies are consistent with end stage degenerative joint disease of the right hip(s) and total hip arthroplasty is deemed medically necessary. The treatment options including medical management, injection therapy, arthroscopy and arthroplasty were discussed at length. The risks and benefits of total hip arthroplasty were presented and reviewed. The risks due to aseptic loosening, infection, stiffness, dislocation/subluxation,  thromboembolic complications and other imponderables were discussed.  The patient acknowledged the explanation, agreed to proceed with the plan and consent was signed. Patient is being admitted for inpatient treatment for surgery, pain control, PT, OT, prophylactic antibiotics, VTE prophylaxis, progressive ambulation and ADL's and discharge planning.The patient is planning to be discharged home.    [place] Tiziana Cislo   PA-C  01/07/2019, 1:43 PM

## 2019-01-07 NOTE — Patient Instructions (Addendum)
[place]  01/07/2019   Your procedure is scheduled on: Tuesday 01/13/2019  Report to [facility] Main  Entrance              Report to admitting at  0610 AM               YOU NEED TO HAVE A COVID 19 TEST ON 01-09-19 @ 10:00 AM_______, THIS TEST MUST BE DONE BEFORE SURGERY, COME TO [place].    Call this number if you have problems the morning of surgery [phone]    Remember: Do not eat food :After Midnight.               NO SOLID FOOD AFTER MIDNIGHT THE NIGHT PRIOR TO SURGERY. NOTHING BY MOUTH EXCEPT CLEAR LIQUIDS UNTIL  0430 am.              PLEASE FINISH ENSURE  DRINK PER SURGEON ORDER  WHICH NEEDS TO BE COMPLETED AT  0430 am.   CLEAR LIQUID DIET   Foods Allowed                                                                     Foods Excluded  Coffee and tea, regular and decaf                             liquids that you cannot  Plain Jell-O in any flavor                                             see through such as: Fruit ices (not with fruit pulp)                                     milk, soups, orange juice  Iced Popsicles                                    All solid food Carbonated beverages, regular and diet                                    Cranberry, grape and apple juices Sports drinks like Gatorade Lightly seasoned clear broth or consume(fat free) Sugar, honey syrup  Sample Menu Breakfast                                Lunch                                     Supper Cranberry juice  Beef broth                            Chicken broth Jell-O                                     Grape juice                           Apple juice Coffee or tea                        Jell-O                                      Popsicle                                                Coffee or tea                        Coffee or  tea  _____________________________________________________________________               BRUSH YOUR TEETH MORNING OF SURGERY AND RINSE YOUR MOUTH OUT, NO CHEWING GUM CANDY OR MINTS.     Take these medicines the morning of surgery with A SIP OF WATER: Pravastatin (Pravachol)                                You may not have any metal on your body including hair pins and              piercings  Do not wear jewelry, make-up, lotions, powders or perfumes, deodorant             Do not wear nail polish.  Do not shave  48 hours prior to surgery.                Do not bring valuables to the hospital. [place].  Contacts, dentures or bridgework may not be worn into surgery.  Leave suitcase in the car. After surgery it may be brought to your room.                   Please read over the following fact sheets you were given: _____________________________________________________________________             [facility] - Preparing for Surgery Before surgery, you can play an important role.  Because skin is not sterile, your skin needs to be as free of germs as possible.  You can reduce the number of germs on your skin by washing with CHG (chlorahexidine gluconate) soap before surgery.  CHG is an antiseptic cleaner which kills germs and bonds with the skin to continue killing germs even after washing. Please DO NOT use if you have an allergy to CHG or antibacterial soaps.  If your skin becomes reddened/irritated stop using the CHG and inform your nurse when you arrive  at Short Stay. Do not shave (including legs and underarms) for at least 48 hours prior to the first CHG shower.  You may shave your face/neck. Please follow these instructions carefully:  1.  Shower with CHG Soap the night before surgery and the  morning of Surgery.  2.  If you choose to wash your hair, wash your hair first as usual with your  normal  shampoo.  3.  After you shampoo,  rinse your hair and body thoroughly to remove the  shampoo.                           4.  Use CHG as you would any other liquid soap.  You can apply chg directly  to the skin and wash                       Gently with a scrungie or clean washcloth.  5.  Apply the CHG Soap to your body ONLY FROM THE NECK DOWN.   Do not use on face/ open                           Wound or open sores. Avoid contact with eyes, ears mouth and genitals (private parts).                       Wash face,  Genitals (private parts) with your normal soap.             6.  Wash thoroughly, paying special attention to the area where your surgery  will be performed.  7.  Thoroughly rinse your body with warm water from the neck down.  8.  DO NOT shower/wash with your normal soap after using and rinsing off  the CHG Soap.                9.  Pat yourself dry with a clean towel.            10.  Wear clean pajamas.            11.  Place clean sheets on your bed the night of your first shower and do not  sleep with pets. Day of Surgery : Do not apply any lotions/deodorants the morning of surgery.  Please wear clean clothes to the hospital/surgery center.  FAILURE TO FOLLOW THESE INSTRUCTIONS MAY RESULT IN THE CANCELLATION OF YOUR SURGERY PATIENT SIGNATURE_________________________________  NURSE SIGNATURE__________________________________  ________________________________________________________________________   Sharon Fitzgerald  An incentive spirometer is a tool that can help keep your lungs clear and active. This tool measures how well you are filling your lungs with each breath. Taking long deep breaths may help reverse or decrease the chance of developing breathing (pulmonary) problems (especially infection) following:  A long period of time when you are unable to move or be active. BEFORE THE PROCEDURE   If the spirometer includes an indicator to show your best effort, your nurse or respiratory therapist will set it  to a desired goal.  If possible, sit up straight or lean slightly forward. Try not to slouch.  Hold the incentive spirometer in an upright position. INSTRUCTIONS FOR USE  1. Sit on the edge of your bed if possible, or sit up as far as you can in bed or on a chair. 2. Hold the incentive spirometer in an upright position. 3. Breathe out normally. 4.  Place the mouthpiece in your mouth and seal your lips tightly around it. 5. Breathe in slowly and as deeply as possible, raising the piston or the ball toward the top of the column. 6. Hold your breath for 3-5 seconds or for as long as possible. Allow the piston or ball to fall to the bottom of the column. 7. Remove the mouthpiece from your mouth and breathe out normally. 8. Rest for a few seconds and repeat Steps 1 through 7 at least 10 times every 1-2 hours when you are awake. Take your time and take a few normal breaths between deep breaths. 9. The spirometer may include an indicator to show your best effort. Use the indicator as a goal to work toward during each repetition. 10. After each set of 10 deep breaths, practice coughing to be sure your lungs are clear. If you have an incision (the cut made at the time of surgery), support your incision when coughing by placing a pillow or rolled up towels firmly against it. Once you are able to get out of bed, walk around indoors and cough well. You may stop using the incentive spirometer when instructed by your caregiver.  RISKS AND COMPLICATIONS  Take your time so you do not get dizzy or light-headed.  If you are in pain, you may need to take or ask for pain medication before doing incentive spirometry. It is harder to take a deep breath if you are having pain. AFTER USE  Rest and breathe slowly and easily.  It can be helpful to keep track of a log of your progress. Your caregiver can provide you with a simple table to help with this. If you are using the spirometer at home, follow these  instructions: [place] IF:   You are having difficultly using the spirometer.  You have trouble using the spirometer as often as instructed.  Your pain medication is not giving enough relief while using the spirometer.  You develop fever of 100.5 F (38.1 C) or higher. SEEK IMMEDIATE MEDICAL CARE IF:   You cough up bloody sputum that had not been present before.  You develop fever of 102 F (38.9 C) or greater.  You develop worsening pain at or near the incision site. MAKE SURE YOU:   Understand these instructions.  Will watch your condition.  Will get help right away if you are not doing well or get worse. Document Released: 12/03/2006 Document Revised: 10/15/2011 Document Reviewed: 02/03/2007 Exit[employer] Patient Information 2014 Exit[employer], [place].   ________________________________________________________________________  WHAT IS A BLOOD TRANSFUSION? Blood Transfusion Information  A transfusion is the replacement of blood or some of its parts. Blood is made up of multiple cells which provide different functions.  Red blood cells carry oxygen and are used for blood loss replacement.  White blood cells fight against infection.  Platelets control bleeding.  Plasma helps clot blood.  Other blood products are available for specialized needs, such as hemophilia or other clotting disorders. BEFORE THE TRANSFUSION  Who gives blood for transfusions?   Healthy volunteers who are fully evaluated to make sure their blood is safe. This is blood bank blood. Transfusion therapy is the safest it has ever been in the practice of medicine. Before blood is taken from a donor, a complete history is taken to make sure that person has no history of diseases nor engages in risky social behavior (examples are intravenous drug use or sexual activity with multiple partners). The donor's travel history is screened  to minimize risk of transmitting infections, such as malaria. The donated  blood is tested for signs of infectious diseases, such as HIV and hepatitis. The blood is then tested to be sure it is compatible with you in order to minimize the chance of a transfusion reaction. If you or a relative donates blood, this is often done in anticipation of surgery and is not appropriate for emergency situations. It takes many days to process the donated blood. RISKS AND COMPLICATIONS Although transfusion therapy is very safe and saves many lives, the main dangers of transfusion include:   Getting an infectious disease.  Developing a transfusion reaction. This is an allergic reaction to something in the blood you were given. Every precaution is taken to prevent this. The decision to have a blood transfusion has been considered carefully by your caregiver before blood is given. Blood is not given unless the benefits outweigh the risks. AFTER THE TRANSFUSION  Right after receiving a blood transfusion, you will usually feel much better and more energetic. This is especially true if your red blood cells have gotten low (anemic). The transfusion raises the level of the red blood cells which carry oxygen, and this usually causes an energy increase.  The nurse administering the transfusion will monitor you carefully for complications. HOME CARE INSTRUCTIONS  No special instructions are needed after a transfusion. You may find your energy is better. Speak with your caregiver about any limitations on activity for underlying diseases you may have. SEEK MEDICAL CARE IF:   Your condition is not improving after your transfusion.  You develop redness or irritation at the intravenous (IV) site. SEEK IMMEDIATE MEDICAL CARE IF:  Any of the following symptoms occur over the next 12 hours:  Shaking chills.  You have a temperature by mouth above 102 F (38.9 C), not controlled by medicine.  Chest, back, or muscle pain.  People around you feel you are not acting correctly or are  confused.  Shortness of breath or difficulty breathing.  Dizziness and fainting.  You get a rash or develop hives.  You have a decrease in urine output.  Your urine turns a dark color or changes to pink, red, or brown. Any of the following symptoms occur over the next 10 days:  You have a temperature by mouth above 102 F (38.9 C), not controlled by medicine.  Shortness of breath.  Weakness after normal activity.  The white part of the eye turns yellow (jaundice).  You have a decrease in the amount of urine or are urinating less often.  Your urine turns a dark color or changes to pink, red, or brown. Document Released: 07/20/2000 Document Revised: 10/15/2011 Document Reviewed: 03/08/2008 [facility] Patient Information 2014 [place], [place].  _______________________________________________________________________

## 2019-01-08 ENCOUNTER — Encounter (HOSPITAL_COMMUNITY)
Admission: RE | Admit: 2019-01-08 | Discharge: 2019-01-08 | Disposition: A | Discharge status: Home / Self Care | Payer: Medicare Other | Source: Ambulatory Visit | Attending: Orthopedic Surgery | Admitting: Orthopedic Surgery

## 2019-01-08 ENCOUNTER — Other Ambulatory Visit: Payer: Self-pay

## 2019-01-08 ENCOUNTER — Encounter (HOSPITAL_COMMUNITY): Payer: Self-pay

## 2019-01-08 DIAGNOSIS — Z01818 Encounter for other preprocedural examination: Secondary | ICD-10-CM | POA: Insufficient documentation

## 2019-01-08 DIAGNOSIS — Z1159 Encounter for screening for other viral diseases: Secondary | ICD-10-CM | POA: Diagnosis not present

## 2019-01-08 DIAGNOSIS — I10 Essential (primary) hypertension: Secondary | ICD-10-CM | POA: Insufficient documentation

## 2019-01-08 LAB — SURGICAL PCR SCREEN
MRSA, PCR: NEGATIVE
Staphylococcus aureus: NEGATIVE

## 2019-01-08 NOTE — Progress Notes (Signed)
5-26-20Surgical Clearance from Dr. Justin Mend,  EKG, CBC w/Diff, CMP, and HGA1c on chart

## 2019-01-09 ENCOUNTER — Other Ambulatory Visit (HOSPITAL_COMMUNITY)
Admission: RE | Admit: 2019-01-09 | Discharge: 2019-01-09 | Disposition: A | Discharge status: Home / Self Care | Payer: Medicare Other | Source: Ambulatory Visit | Attending: Orthopedic Surgery | Admitting: Orthopedic Surgery

## 2019-01-09 DIAGNOSIS — Z01812 Encounter for preprocedural laboratory examination: Secondary | ICD-10-CM | POA: Insufficient documentation

## 2019-01-09 DIAGNOSIS — Z01818 Encounter for other preprocedural examination: Secondary | ICD-10-CM | POA: Diagnosis not present

## 2019-01-09 DIAGNOSIS — Z1159 Encounter for screening for other viral diseases: Secondary | ICD-10-CM | POA: Insufficient documentation

## 2019-01-09 NOTE — Progress Notes (Signed)
Anesthesia Chart Review   Case:  [id number] Date/Time:  01/13/19 0825   Procedure:  TOTAL HIP ARTHROPLASTY ANTERIOR APPROACH (Right ) - 70 mins   Anesthesia type:  Spinal   Pre-op diagnosis:  Right hip osteoarthritis   Location:  [facility] ROOM 10 / [facility]   Surgeon:  Paralee Cancel, MD      DISCUSSION: 78 yo never smoker with h/o HTN, right hip OA scheduled for above procedure 01/13/2019 with Dr. Paralee Cancel.   Pt last seen by PCP, Dr. Maurice Small, on 12/29/2018.  Per her OV note, "Patient is cleared for surgery without any further testing."  Anticipate pt can proceed with planned procedure barring acute status change.  VS: BP 137/81 (BP Location: Right Arm)   Pulse 86   Temp 36.6 C (Oral)   Resp 18   Ht 5\' 3"  (1.6 m)   Wt 87.7 kg   SpO2 99%   BMI 34.25 kg/m   PROVIDERS: Maurice Small, MD is PCP    LABS: Labs on chart, acceptable for surgery.  (all labs ordered are listed, but only abnormal results are displayed)  Labs Reviewed  SURGICAL PCR SCREEN  TYPE AND SCREEN     IMAGES:   EKG: 12/30/2018 (on chart) Rate 71 bpm Interpretation:  Sinus rhythm   CV:  Past Medical History:  Diagnosis Date  . Arthritis   . Hypertension   . Spinal stenosis    sees Dr. Rolena Infante    Past Surgical History:  Procedure Laterality Date  . BREAST SURGERY Bilateral    benign fibroid cyst  . COLONOSCOPY    . JOINT REPLACEMENT Right   . left hand surgury from fracture    . LUMBAR LAMINECTOMY/DECOMPRESSION MICRODISCECTOMY N/A 04/30/2013   Procedure: L3-S1 DECOMPRESSION, L4-S1 INSTRUMENTED POSTERIOR SPINAL FUSION;  Surgeon: Melina Schools, MD;  Location: [place];  Service: Orthopedics;  Laterality: N/A;  . TOTAL KNEE ARTHROPLASTY Right 01/15/2013   Procedure: RIGHT TOTAL KNEE ARTHROPLASTY;  Surgeon: [name], MD;  Location: [facility];  Service: Orthopedics;  Laterality: Right;  . TUBAL LIGATION      MEDICATIONS: . aspirin 325 MG tablet  . Cholecalciferol (VITAMIN D3) 2000 UNITS TABS  .  Cyanocobalamin (VITAMIN B12 PO)  . ferrous sulfate 325 (65 FE) MG tablet  . lisinopril-hydrochlorothiazide (PRINZIDE,ZESTORETIC) 10-12.5 MG per tablet  . pravastatin (PRAVACHOL) 20 MG tablet  . traMADol (ULTRAM) 50 MG tablet   No current facility-administered medications for this encounter.    Maia Plan [facility] Pre-Surgical Testing [phone] 01/09/19 1:53 PM

## 2019-01-09 NOTE — Anesthesia Preprocedure Evaluation (Addendum)
Anesthesia Evaluation  Patient identified by MRN, date of birth, ID band Patient awake    Reviewed: Allergy & Precautions, NPO status , Patient's Chart, lab work & pertinent test results  Airway Mallampati: I  TM Distance: >3 FB Neck ROM: Full    Dental no notable dental hx. (+) Teeth Intact, Dental Advisory Given   Pulmonary neg pulmonary ROS,    Pulmonary exam normal breath sounds clear to auscultation       Cardiovascular hypertension, negative cardio ROS Normal cardiovascular exam Rhythm:Regular Rate:Normal     Neuro/Psych negative neurological ROS  negative psych ROS   GI/Hepatic negative GI ROS, Neg liver ROS,   Endo/Other  negative endocrine ROS  Renal/GU negative Renal ROS  negative genitourinary   Musculoskeletal  (+) Arthritis ,   Abdominal   Peds  Hematology negative hematology ROS (+)   Anesthesia Other Findings   Reproductive/Obstetrics                           Anesthesia Physical Anesthesia Plan  ASA: II  Anesthesia Plan: Spinal   Post-op Pain Management:    Induction:   PONV Risk Score and Plan: Treatment may vary due to age or medical condition  Airway Management Planned: Natural Airway  Additional Equipment:   Intra-op Plan:   Post-operative Plan:   Informed Consent: I have reviewed the patients History and Physical, chart, labs and discussed the procedure including the risks, benefits and alternatives for the proposed anesthesia with the patient or authorized representative who has indicated his/her understanding and acceptance.     Dental advisory given  Plan Discussed with: CRNA  Anesthesia Plan Comments:       Anesthesia Quick Evaluation

## 2019-01-10 LAB — NOVEL CORONAVIRUS, NAA (HOSP ORDER, SEND-OUT TO REF LAB; TAT 18-24 HRS): SARS-CoV-2, NAA: NOT DETECTED

## 2019-01-12 NOTE — Progress Notes (Signed)
Left message to call back if any symptoms of Covid 19   SPOKE W/  _     SCREENING SYMPTOMS OF COVID 19:   COUGH--  RUNNY NOSE---   SORE THROAT---  NASAL CONGESTION----  SNEEZING----  SHORTNESS OF BREATH---  DIFFICULTY BREATHING---  TEMP >100.0 -----  UNEXPLAINED BODY ACHES------  CHILLS --------   HEADACHES ---------  LOSS OF SMELL/ TASTE --------    HAVE YOU OR ANY FAMILY MEMBER TRAVELLED PAST 14 DAYS OUT OF THE   COUNTY--- STATE---- COUNTRY----  HAVE YOU OR ANY FAMILY MEMBER BEEN EXPOSED TO ANYONE WITH COVID 19?     

## 2019-01-13 ENCOUNTER — Other Ambulatory Visit: Payer: Self-pay

## 2019-01-13 ENCOUNTER — Inpatient Hospital Stay (HOSPITAL_COMMUNITY): Payer: Medicare Other | Admitting: Anesthesiology

## 2019-01-13 ENCOUNTER — Inpatient Hospital Stay (HOSPITAL_COMMUNITY): Payer: Medicare Other | Admitting: Physician Assistant

## 2019-01-13 ENCOUNTER — Inpatient Hospital Stay (HOSPITAL_COMMUNITY)
Admission: RE | Admit: 2019-01-13 | Discharge: 2019-01-14 | DRG: 470 | Disposition: A | Discharge status: Home / Self Care | Payer: Medicare Other | Attending: Orthopedic Surgery | Admitting: Orthopedic Surgery

## 2019-01-13 ENCOUNTER — Inpatient Hospital Stay (HOSPITAL_COMMUNITY): Payer: Medicare Other

## 2019-01-13 ENCOUNTER — Observation Stay (HOSPITAL_COMMUNITY): Payer: Medicare Other

## 2019-01-13 ENCOUNTER — Encounter (HOSPITAL_COMMUNITY)
Admission: RE | Disposition: A | Discharge status: Home / Self Care | Payer: Self-pay | Source: Home / Self Care | Attending: Orthopedic Surgery

## 2019-01-13 ENCOUNTER — Encounter (HOSPITAL_COMMUNITY): Payer: Self-pay

## 2019-01-13 DIAGNOSIS — M1611 Unilateral primary osteoarthritis, right hip: Principal | ICD-10-CM | POA: Diagnosis present

## 2019-01-13 DIAGNOSIS — Z96649 Presence of unspecified artificial hip joint: Secondary | ICD-10-CM

## 2019-01-13 DIAGNOSIS — I10 Essential (primary) hypertension: Secondary | ICD-10-CM | POA: Diagnosis present

## 2019-01-13 DIAGNOSIS — Z79899 Other long term (current) drug therapy: Secondary | ICD-10-CM

## 2019-01-13 DIAGNOSIS — E669 Obesity, unspecified: Secondary | ICD-10-CM | POA: Diagnosis present

## 2019-01-13 DIAGNOSIS — M25551 Pain in right hip: Secondary | ICD-10-CM | POA: Diagnosis present

## 2019-01-13 DIAGNOSIS — Z6834 Body mass index (BMI) 34.0-34.9, adult: Secondary | ICD-10-CM

## 2019-01-13 DIAGNOSIS — M25751 Osteophyte, right hip: Secondary | ICD-10-CM | POA: Diagnosis present

## 2019-01-13 DIAGNOSIS — Z96651 Presence of right artificial knee joint: Secondary | ICD-10-CM | POA: Diagnosis present

## 2019-01-13 DIAGNOSIS — Z7982 Long term (current) use of aspirin: Secondary | ICD-10-CM | POA: Diagnosis not present

## 2019-01-13 DIAGNOSIS — E785 Hyperlipidemia, unspecified: Secondary | ICD-10-CM | POA: Diagnosis present

## 2019-01-13 DIAGNOSIS — Z96641 Presence of right artificial hip joint: Secondary | ICD-10-CM

## 2019-01-13 DIAGNOSIS — Z419 Encounter for procedure for purposes other than remedying health state, unspecified: Secondary | ICD-10-CM

## 2019-01-13 DIAGNOSIS — Z8249 Family history of ischemic heart disease and other diseases of the circulatory system: Secondary | ICD-10-CM | POA: Diagnosis not present

## 2019-01-13 HISTORY — PX: TOTAL HIP ARTHROPLASTY: SHX124

## 2019-01-13 LAB — TYPE AND SCREEN
ABO/RH(D): A POS
Antibody Screen: NEGATIVE

## 2019-01-13 SURGERY — ARTHROPLASTY, HIP, TOTAL, ANTERIOR APPROACH
Anesthesia: Spinal | Site: Hip | Laterality: Right

## 2019-01-13 MED ORDER — FERROUS SULFATE 325 (65 FE) MG PO TABS: 325.0000 mg | ORAL_TABLET | Freq: Three times a day (TID) | ORAL | 3 refills | Status: DC

## 2019-01-13 MED ORDER — BUPIVACAINE IN DEXTROSE 0.75-8.25 % IT SOLN
INTRATHECAL | Status: DC | PRN
  Administered 2019-01-13: 1.6 mL via INTRATHECAL

## 2019-01-13 MED ORDER — ASPIRIN 81 MG PO CHEW: 81.0000 mg | CHEWABLE_TABLET | Freq: Two times a day (BID) | ORAL | 0 refills | Status: AC

## 2019-01-13 MED ORDER — MEPERIDINE HCL 50 MG/ML IJ SOLN
6.2500 mg | INTRAMUSCULAR | Status: DC | PRN
  Administered 2019-01-13: 12.5 mg via INTRAVENOUS

## 2019-01-13 MED ORDER — ACETAMINOPHEN 500 MG PO TABS
1000.0000 mg | ORAL_TABLET | Freq: Once | ORAL | Status: AC
  Administered 2019-01-13: 1000 mg via ORAL
  Filled 2019-01-13: qty 2

## 2019-01-13 MED ORDER — ONDANSETRON HCL 4 MG/2ML IJ SOLN
INTRAMUSCULAR | Status: AC
  Filled 2019-01-13: qty 2

## 2019-01-13 MED ORDER — DEXAMETHASONE SODIUM PHOSPHATE 10 MG/ML IJ SOLN
10.0000 mg | Freq: Once | INTRAMUSCULAR | Status: AC
  Administered 2019-01-14: 10 mg via INTRAVENOUS
  Filled 2019-01-13: qty 1

## 2019-01-13 MED ORDER — STERILE WATER FOR IRRIGATION IR SOLN
Status: DC | PRN
  Administered 2019-01-13 (×2): 1000 mL

## 2019-01-13 MED ORDER — METOCLOPRAMIDE HCL 5 MG/ML IJ SOLN: 5.0000 mg | Freq: Three times a day (TID) | INTRAMUSCULAR | Status: DC | PRN

## 2019-01-13 MED ORDER — PHENYLEPHRINE 40 MCG/ML (10ML) SYRINGE FOR IV PUSH (FOR BLOOD PRESSURE SUPPORT)
PREFILLED_SYRINGE | INTRAVENOUS | Status: DC | PRN
  Administered 2019-01-13 (×5): 80 ug via INTRAVENOUS

## 2019-01-13 MED ORDER — DEXAMETHASONE SODIUM PHOSPHATE 10 MG/ML IJ SOLN
INTRAMUSCULAR | Status: AC
  Filled 2019-01-13: qty 1

## 2019-01-13 MED ORDER — DOCUSATE SODIUM 100 MG PO CAPS
100.0000 mg | ORAL_CAPSULE | Freq: Two times a day (BID) | ORAL | Status: DC
  Administered 2019-01-13 – 2019-01-14 (×2): 100 mg via ORAL
  Filled 2019-01-13 (×2): qty 1

## 2019-01-13 MED ORDER — POLYETHYLENE GLYCOL 3350 17 G PO PACK: 17.0000 g | PACK | Freq: Two times a day (BID) | ORAL | 0 refills | Status: DC

## 2019-01-13 MED ORDER — DEXAMETHASONE SODIUM PHOSPHATE 10 MG/ML IJ SOLN
10.0000 mg | Freq: Once | INTRAMUSCULAR | Status: AC
  Administered 2019-01-13: 10 mg via INTRAVENOUS

## 2019-01-13 MED ORDER — BISACODYL 10 MG RE SUPP: 10.0000 mg | Freq: Every day | RECTAL | Status: DC | PRN

## 2019-01-13 MED ORDER — PROPOFOL 500 MG/50ML IV EMUL
INTRAVENOUS | Status: DC | PRN
  Administered 2019-01-13: 100 ug/kg/min via INTRAVENOUS

## 2019-01-13 MED ORDER — PRAVASTATIN SODIUM 20 MG PO TABS
20.0000 mg | ORAL_TABLET | Freq: Every day | ORAL | Status: DC
  Administered 2019-01-14: 20 mg via ORAL
  Filled 2019-01-13: qty 1

## 2019-01-13 MED ORDER — SODIUM CHLORIDE 0.9 % IR SOLN
Status: DC | PRN
  Administered 2019-01-13: 1000 mL

## 2019-01-13 MED ORDER — FENTANYL CITRATE (PF) 100 MCG/2ML IJ SOLN
INTRAMUSCULAR | Status: AC
  Filled 2019-01-13: qty 2

## 2019-01-13 MED ORDER — HYDROCODONE-ACETAMINOPHEN 7.5-325 MG PO TABS: 1.0000 | ORAL_TABLET | ORAL | Status: DC | PRN

## 2019-01-13 MED ORDER — ASPIRIN 81 MG PO CHEW
81.0000 mg | CHEWABLE_TABLET | Freq: Two times a day (BID) | ORAL | Status: DC
  Administered 2019-01-13 – 2019-01-14 (×2): 81 mg via ORAL
  Filled 2019-01-13 (×2): qty 1

## 2019-01-13 MED ORDER — MAGNESIUM CITRATE PO SOLN: 1.0000 | Freq: Once | ORAL | Status: DC | PRN

## 2019-01-13 MED ORDER — LACTATED RINGERS IV SOLN
INTRAVENOUS | Status: DC
  Administered 2019-01-13 (×2): via INTRAVENOUS

## 2019-01-13 MED ORDER — TRANEXAMIC ACID-NACL 1000-0.7 MG/100ML-% IV SOLN
1000.0000 mg | INTRAVENOUS | Status: AC
  Administered 2019-01-13: 1000 mg via INTRAVENOUS
  Filled 2019-01-13: qty 100

## 2019-01-13 MED ORDER — METHOCARBAMOL 500 MG PO TABS
500.0000 mg | ORAL_TABLET | Freq: Four times a day (QID) | ORAL | Status: DC | PRN
  Administered 2019-01-13: 500 mg via ORAL
  Filled 2019-01-13: qty 1

## 2019-01-13 MED ORDER — ALUM & MAG HYDROXIDE-SIMETH 200-200-20 MG/5ML PO SUSP: 15.0000 mL | ORAL | Status: DC | PRN

## 2019-01-13 MED ORDER — CEFAZOLIN SODIUM-DEXTROSE 2-4 GM/100ML-% IV SOLN
2.0000 g | INTRAVENOUS | Status: AC
  Administered 2019-01-13: 2 g via INTRAVENOUS
  Filled 2019-01-13: qty 100

## 2019-01-13 MED ORDER — PHENOL 1.4 % MT LIQD: 1.0000 | OROMUCOSAL | Status: DC | PRN

## 2019-01-13 MED ORDER — 0.9 % SODIUM CHLORIDE (POUR BTL) OPTIME
TOPICAL | Status: DC | PRN
  Administered 2019-01-13: 1000 mL

## 2019-01-13 MED ORDER — MIDAZOLAM HCL 2 MG/2ML IJ SOLN
INTRAMUSCULAR | Status: AC
  Filled 2019-01-13: qty 2

## 2019-01-13 MED ORDER — METOCLOPRAMIDE HCL 5 MG PO TABS: 5.0000 mg | ORAL_TABLET | Freq: Three times a day (TID) | ORAL | Status: DC | PRN

## 2019-01-13 MED ORDER — MENTHOL 3 MG MT LOZG: 1.0000 | LOZENGE | OROMUCOSAL | Status: DC | PRN

## 2019-01-13 MED ORDER — DIPHENHYDRAMINE HCL 12.5 MG/5ML PO ELIX: 12.5000 mg | ORAL_SOLUTION | ORAL | Status: DC | PRN

## 2019-01-13 MED ORDER — POLYETHYLENE GLYCOL 3350 17 G PO PACK
17.0000 g | PACK | Freq: Two times a day (BID) | ORAL | Status: DC
  Administered 2019-01-13 – 2019-01-14 (×2): 17 g via ORAL
  Filled 2019-01-13 (×2): qty 1

## 2019-01-13 MED ORDER — SODIUM CHLORIDE 0.9 % IV SOLN
INTRAVENOUS | Status: DC
  Administered 2019-01-13 – 2019-01-14 (×2): via INTRAVENOUS

## 2019-01-13 MED ORDER — PROPOFOL 10 MG/ML IV BOLUS
INTRAVENOUS | Status: DC | PRN
  Administered 2019-01-13 (×3): 20 mg via INTRAVENOUS

## 2019-01-13 MED ORDER — PHENYLEPHRINE 40 MCG/ML (10ML) SYRINGE FOR IV PUSH (FOR BLOOD PRESSURE SUPPORT)
PREFILLED_SYRINGE | INTRAVENOUS | Status: AC
  Filled 2019-01-13: qty 10

## 2019-01-13 MED ORDER — TRANEXAMIC ACID-NACL 1000-0.7 MG/100ML-% IV SOLN
1000.0000 mg | Freq: Once | INTRAVENOUS | Status: AC
  Administered 2019-01-13: 1000 mg via INTRAVENOUS
  Filled 2019-01-13: qty 100

## 2019-01-13 MED ORDER — METHOCARBAMOL 500 MG PO TABS: 500.0000 mg | ORAL_TABLET | Freq: Four times a day (QID) | ORAL | 0 refills | Status: DC | PRN

## 2019-01-13 MED ORDER — METHOCARBAMOL 500 MG IVPB - SIMPLE MED
500.0000 mg | Freq: Four times a day (QID) | INTRAVENOUS | Status: DC | PRN
  Filled 2019-01-13: qty 50

## 2019-01-13 MED ORDER — METHOCARBAMOL 500 MG IVPB - SIMPLE MED
INTRAVENOUS | Status: AC
  Filled 2019-01-13: qty 50

## 2019-01-13 MED ORDER — ONDANSETRON HCL 4 MG PO TABS: 4.0000 mg | ORAL_TABLET | Freq: Four times a day (QID) | ORAL | Status: DC | PRN

## 2019-01-13 MED ORDER — HYDROMORPHONE HCL 1 MG/ML IJ SOLN: 0.5000 mg | INTRAMUSCULAR | Status: DC | PRN

## 2019-01-13 MED ORDER — PROPOFOL 10 MG/ML IV BOLUS
INTRAVENOUS | Status: AC
  Filled 2019-01-13: qty 80

## 2019-01-13 MED ORDER — HYDROCODONE-ACETAMINOPHEN 7.5-325 MG PO TABS: 1.0000 | ORAL_TABLET | ORAL | 0 refills | Status: DC | PRN

## 2019-01-13 MED ORDER — CELECOXIB 200 MG PO CAPS
200.0000 mg | ORAL_CAPSULE | Freq: Two times a day (BID) | ORAL | Status: DC
  Administered 2019-01-13: 200 mg via ORAL
  Filled 2019-01-13: qty 1

## 2019-01-13 MED ORDER — HYDROCODONE-ACETAMINOPHEN 5-325 MG PO TABS
1.0000 | ORAL_TABLET | ORAL | Status: DC | PRN
  Administered 2019-01-13 (×3): 2 via ORAL
  Filled 2019-01-13 (×4): qty 2

## 2019-01-13 MED ORDER — ACETAMINOPHEN 325 MG PO TABS: 325.0000 mg | ORAL_TABLET | Freq: Four times a day (QID) | ORAL | Status: DC | PRN

## 2019-01-13 MED ORDER — FENTANYL CITRATE (PF) 100 MCG/2ML IJ SOLN
25.0000 ug | INTRAMUSCULAR | Status: DC | PRN
  Administered 2019-01-13 (×2): 50 ug via INTRAVENOUS

## 2019-01-13 MED ORDER — MEPERIDINE HCL 50 MG/ML IJ SOLN
INTRAMUSCULAR | Status: AC
  Filled 2019-01-13: qty 1

## 2019-01-13 MED ORDER — MIDAZOLAM HCL 5 MG/5ML IJ SOLN
INTRAMUSCULAR | Status: DC | PRN
  Administered 2019-01-13: 2 mg via INTRAVENOUS

## 2019-01-13 MED ORDER — CEFAZOLIN SODIUM-DEXTROSE 2-4 GM/100ML-% IV SOLN
2.0000 g | Freq: Four times a day (QID) | INTRAVENOUS | Status: AC
  Administered 2019-01-13 (×2): 2 g via INTRAVENOUS
  Filled 2019-01-13 (×2): qty 100

## 2019-01-13 MED ORDER — DOCUSATE SODIUM 100 MG PO CAPS: 100.0000 mg | ORAL_CAPSULE | Freq: Two times a day (BID) | ORAL | 0 refills | Status: DC

## 2019-01-13 MED ORDER — CHLORHEXIDINE GLUCONATE 4 % EX LIQD: 60.0000 mL | Freq: Once | CUTANEOUS | Status: DC

## 2019-01-13 MED ORDER — ONDANSETRON HCL 4 MG/2ML IJ SOLN: 4.0000 mg | Freq: Four times a day (QID) | INTRAMUSCULAR | Status: DC | PRN

## 2019-01-13 MED ORDER — ONDANSETRON HCL 4 MG/2ML IJ SOLN
INTRAMUSCULAR | Status: DC | PRN
  Administered 2019-01-13: 4 mg via INTRAVENOUS

## 2019-01-13 MED ORDER — FERROUS SULFATE 325 (65 FE) MG PO TABS
325.0000 mg | ORAL_TABLET | Freq: Three times a day (TID) | ORAL | Status: DC
  Administered 2019-01-13 – 2019-01-14 (×2): 325 mg via ORAL
  Filled 2019-01-13 (×3): qty 1

## 2019-01-13 SURGICAL SUPPLY — 45 items
BAG DECANTER FOR FLEXI CONT (MISCELLANEOUS) IMPLANT
BAG ZIPLOCK 12X15 (MISCELLANEOUS) IMPLANT
BLADE SAG 18X100X1.27 (BLADE) ×3 IMPLANT
BLADE SURG SZ10 CARB STEEL (BLADE) ×6 IMPLANT
COVER PERINEAL POST (MISCELLANEOUS) ×3 IMPLANT
COVER SURGICAL LIGHT HANDLE (MISCELLANEOUS) ×3 IMPLANT
COVER WAND RF STERILE (DRAPES) IMPLANT
CUP ACET PINNACLE SECTR 50MM (Hips) ×1 IMPLANT
DERMABOND ADVANCED (GAUZE/BANDAGES/DRESSINGS) ×2
DERMABOND ADVANCED .7 DNX12 (GAUZE/BANDAGES/DRESSINGS) ×1 IMPLANT
DRAPE STERI IOBAN 125X83 (DRAPES) ×3 IMPLANT
DRAPE U-SHAPE 47X51 STRL (DRAPES) ×6 IMPLANT
DRESSING AQUACEL AG SP 3.5X10 (GAUZE/BANDAGES/DRESSINGS) ×1 IMPLANT
DRSG AQUACEL AG SP 3.5X10 (GAUZE/BANDAGES/DRESSINGS) ×3
DURAPREP 26ML APPLICATOR (WOUND CARE) ×3 IMPLANT
ELECT BLADE TIP CTD 4 INCH (ELECTRODE) ×3 IMPLANT
ELECT REM PT RETURN 15FT ADLT (MISCELLANEOUS) ×3 IMPLANT
ELIMINATOR HOLE APEX DEPUY (Hips) ×3 IMPLANT
FEM STEM 12/14 TAPER SZ 4 HIP (Orthopedic Implant) ×3 IMPLANT
FEMORAL STEM 12/14 TPR SZ4 HIP (Orthopedic Implant) ×1 IMPLANT
GLOVE BIOGEL PI IND STRL 7.5 (GLOVE) ×1 IMPLANT
GLOVE BIOGEL PI IND STRL 8.5 (GLOVE) ×1 IMPLANT
GLOVE BIOGEL PI INDICATOR 7.5 (GLOVE) ×2
GLOVE BIOGEL PI INDICATOR 8.5 (GLOVE) ×2
GLOVE ECLIPSE 8.0 STRL XLNG CF (GLOVE) ×6 IMPLANT
GLOVE ORTHO TXT STRL SZ7.5 (GLOVE) ×3 IMPLANT
GOWN STRL REUS W/TWL 2XL LVL3 (GOWN DISPOSABLE) ×3 IMPLANT
GOWN STRL REUS W/TWL LRG LVL3 (GOWN DISPOSABLE) ×3 IMPLANT
HEAD FEM STD 32X+1 STRL (Hips) ×3 IMPLANT
HOLDER FOLEY CATH W/STRAP (MISCELLANEOUS) ×3 IMPLANT
KIT TURNOVER KIT A (KITS) IMPLANT
LINER ACET PNNCL PLUS4 NEUTRAL (Hips) ×1 IMPLANT
PACK ANTERIOR HIP CUSTOM (KITS) ×3 IMPLANT
PINNACLE PLUS 4 NEUTRAL (Hips) ×3 IMPLANT
PINNACLE SECTOR CUP 50MM (Hips) ×3 IMPLANT
SCREW 6.5MMX30MM (Screw) ×3 IMPLANT
SUT MNCRL AB 4-0 PS2 18 (SUTURE) ×3 IMPLANT
SUT STRATAFIX 0 PDS 27 VIOLET (SUTURE) ×3
SUT VIC AB 1 CT1 36 (SUTURE) ×9 IMPLANT
SUT VIC AB 2-0 CT1 27 (SUTURE) ×4
SUT VIC AB 2-0 CT1 TAPERPNT 27 (SUTURE) ×2 IMPLANT
SUTURE STRATFX 0 PDS 27 VIOLET (SUTURE) ×1 IMPLANT
TRAY FOLEY MTR SLVR 16FR STAT (SET/KITS/TRAYS/PACK) IMPLANT
WATER STERILE IRR 1000ML POUR (IV SOLUTION) ×3 IMPLANT
YANKAUER SUCT BULB TIP 10FT TU (MISCELLANEOUS) IMPLANT

## 2019-01-13 NOTE — Interval H&P Note (Signed)
History and Physical Interval Note:  01/13/2019 6:37 AM  [place]  has presented today for surgery, with the diagnosis of Right hip osteoarthritis.  The various methods of treatment have been discussed with the patient and family. After consideration of risks, benefits and other options for treatment, the patient has consented to  Procedure(s) with comments: TOTAL HIP ARTHROPLASTY ANTERIOR APPROACH (Right) - 70 mins as a surgical intervention.  The patient's history has been reviewed, patient examined, no change in status, stable for surgery.  I have reviewed the patient's chart and labs.  Questions were answered to the patient's satisfaction.     [name]

## 2019-01-13 NOTE — Transfer of Care (Signed)
Immediate Anesthesia Transfer of Care Note  Patient: Sharon Fitzgerald  Procedure(s) Performed: RIGHT TOTAL HIP ARTHROPLASTY ANTERIOR APPROACH (Right Hip)  Patient Location: PACU  Anesthesia Type:MAC and Spinal  Level of Consciousness: awake, alert , oriented and patient cooperative  Airway & Oxygen Therapy: Patient Spontanous Breathing and Patient connected to face mask oxygen  Post-op Assessment: Report given to RN and Post -op Vital signs reviewed and stable  Post vital signs: Reviewed and stable  Last Vitals:  Vitals Value Taken Time  BP    Temp    Pulse 62 01/13/2019 10:39 AM  Resp    SpO2 100 % 01/13/2019 10:39 AM  Vitals shown include unvalidated device data.  Last Pain:  Vitals:   01/13/19 0636  TempSrc: Oral  PainSc: 0-No pain         Complications: No apparent anesthesia complications

## 2019-01-13 NOTE — Evaluation (Signed)
Physical Therapy Evaluation Patient Details Name: Sharon Fitzgerald MRN: [id number] DOB: [birth year] Today's Date: 01/13/2019   History of Present Illness  Pt s/p R THR and with hx of R TKR and lumbar laminectomy.  Clinical Impression  Pt s/p R THR and presents with decreased R LE strength/ROM and post op pain limiting functional mobility.  Pt should progress to dc home with family assist.    Follow Up Recommendations Follow surgeon's recommendation for DC plan and follow-up therapies    Equipment Recommendations  None recommended by PT    Recommendations for Other Services       Precautions / Restrictions Precautions Precautions: Fall Restrictions Weight Bearing Restrictions: No Other Position/Activity Restrictions: WBAT      Mobility  Bed Mobility Overal bed mobility: Needs Assistance Bed Mobility: Supine to Sit     Supine to sit: Min assist     General bed mobility comments: cues for sequence with min assist to manage R LE  Transfers Overall transfer level: Needs assistance Equipment used: Rolling walker (2 wheeled) Transfers: Sit to/from Stand Sit to Stand: Min assist         General transfer comment: cues for LE management and use of UEs to self assist  Ambulation/Gait Ambulation/Gait assistance: Min assist Gait Distance (Feet): 28 Feet Assistive device: Rolling walker (2 wheeled) Gait Pattern/deviations: Step-to pattern;Decreased step length - right;Decreased step length - left;Shuffle;Trunk flexed Gait velocity: decr   General Gait Details: cues for posture, position from RW and sequence  Stairs            Wheelchair Mobility    Modified Rankin (Stroke Patients Only)       Balance Overall balance assessment: Needs assistance Sitting-balance support: No upper extremity supported;Feet supported Sitting balance-Leahy Scale: Good     Standing balance support: Bilateral upper extremity supported Standing balance-Leahy Scale: Poor                                Pertinent Vitals/Pain Pain Assessment: 0-10 Pain Score: 3  Pain Location: R hip Pain Descriptors / Indicators: Aching;Sore Pain Intervention(s): Limited activity within patient's tolerance;Monitored during session;Premedicated before session;Ice applied    Home Living Family/patient expects to be discharged to:: Private residence Living Arrangements: Children Available Help at Discharge: Family Type of Home: House Home Access: Stairs to enter Entrance Stairs-Rails: Right;Left;Can reach both Entrance Stairs-Number of Steps: 6 Home Layout: Two level Home Equipment: Environmental consultant - 2 wheels;Cane - single point      Prior Function Level of Independence: Independent;Independent with assistive device(s)         Comments: used cane outside     Hand Dominance   Dominant Hand: Right    Extremity/Trunk Assessment   Upper Extremity Assessment Upper Extremity Assessment: Overall WFL for tasks assessed    Lower Extremity Assessment Lower Extremity Assessment: RLE deficits/detail    Cervical / Trunk Assessment Cervical / Trunk Assessment: Normal  Communication   Communication: No difficulties  Cognition Arousal/Alertness: Awake/alert Behavior During Therapy: WFL for tasks assessed/performed Overall Cognitive Status: Within Functional Limits for tasks assessed                                        General Comments      Exercises     Assessment/Plan    PT Assessment Patient needs continued PT services  PT Problem List Decreased strength;Decreased range of motion;Decreased activity tolerance;Decreased mobility;Decreased knowledge of use of DME;Pain;Obesity       PT Treatment Interventions DME instruction;Gait training;Stair training;Functional mobility training;Therapeutic activities;Therapeutic exercise;Patient/family education    PT Goals (Current goals can be found in the Care Plan section)  Acute Rehab PT  Goals Patient Stated Goal: Walk without pain PT Goal Formulation: With patient Time For Goal Achievement: 01/20/19 Potential to Achieve Goals: Good    Frequency 7X/week   Barriers to discharge        Co-evaluation               AM-PAC PT "6 Clicks" Mobility  Outcome Measure Help needed turning from your back to your side while in a flat bed without using bedrails?: A Little Help needed moving from lying on your back to sitting on the side of a flat bed without using bedrails?: A Little Help needed moving to and from a bed to a chair (including a wheelchair)?: A Little Help needed standing up from a chair using your arms (e.g., wheelchair or bedside chair)?: A Little Help needed to walk in hospital room?: A Little Help needed climbing 3-5 steps with a railing? : A Lot 6 Click Score: 17    End of Session Equipment Utilized During Treatment: Gait belt Activity Tolerance: Patient tolerated treatment well Patient left: in chair;with call bell/phone within reach;with chair alarm set Nurse Communication: Mobility status PT Visit Diagnosis: Difficulty in walking, not elsewhere classified (R26.2)    Time: 6384-6659 PT Time Calculation (min) (ACUTE ONLY): 21 min   Charges:   PT Evaluation $PT Eval Low Complexity: 1 Low          [place] Pager [phone] Office [phone]   Tykerria Mccubbins 01/13/2019, 3:27 PM

## 2019-01-13 NOTE — Anesthesia Procedure Notes (Signed)
Spinal  Patient location during procedure: OR Start time: 01/13/2019 8:30 AM End time: 01/13/2019 8:50 AM Staffing Anesthesiologist: Freddrick March, MD Performed: anesthesiologist  Preanesthetic Checklist Completed: patient identified, surgical consent, pre-op evaluation, timeout performed, IV checked, risks and benefits discussed and monitors and equipment checked Spinal Block Patient position: sitting Prep: site prepped and draped and DuraPrep Patient monitoring: cardiac monitor, continuous pulse ox and blood pressure Approach: midline Location: L3-4 Injection technique: single-shot Needle Needle type: Whitacre and Quincke  Needle gauge: 22 G Needle length: 9 cm Assessment Sensory level: T6 Additional Notes Functioning IV was confirmed and monitors were applied. Sterile prep and drape, including hand hygiene and sterile gloves were used. The patient was positioned and the spine was prepped. The skin was anesthetized with lidocaine.  Free flow of clear CSF was obtained prior to injecting local anesthetic into the CSF.  The spinal needle aspirated freely following injection.  The needle was carefully withdrawn.  The patient tolerated the procedure well.

## 2019-01-13 NOTE — Op Note (Signed)
NAME:  Sharon Fitzgerald                ACCOUNT NO.: [id number]      MEDICAL RECORD NO.: [id number]      FACILITY:  [facility]      PHYSICIAN:  [name]  DATE OF BIRTH:  Oct 11, 1940     DATE OF PROCEDURE:  01/13/2019                                 OPERATIVE REPORT         PREOPERATIVE DIAGNOSIS: Right  hip osteoarthritis.      POSTOPERATIVE DIAGNOSIS:  Right hip osteoarthritis.      PROCEDURE:  Right total hip replacement through an anterior approach   utilizing DePuy THR system, component size 46mm pinnacle cup, a size 32+4 neutral   Altrex liner, a size 4 HI Actis stem with a 31+1 Articuleze metal head ball.      SURGEON:  Pietro Cassis. Alvan Dame, M.D.      ASSISTANT:  Danae Orleans, PA-C     ANESTHESIA:  Spinal.      SPECIMENS:  None.      COMPLICATIONS:  None.      BLOOD LOSS:  300 cc     DRAINS:  None.      INDICATION OF THE PROCEDURE:  Sharon Fitzgerald is a 78 y.o. female who had   presented to office for evaluation of right hip pain.  Radiographs revealed   progressive degenerative changes with bone-on-bone   articulation of the  hip joint, including subchondral cystic changes and osteophytes.  The patient had painful limited range of   motion significantly affecting their overall quality of life and function.  The patient was failing to    respond to conservative measures including medications and/or injections and activity modification and at this point was ready   to proceed with more definitive measures.  Consent was obtained for   benefit of pain relief.  Specific risks of infection, DVT, component   failure, dislocation, neurovascular injury, and need for revision surgery were reviewed in the office as well discussion of   the anterior versus posterior approach were reviewed.     PROCEDURE IN DETAIL:  The patient was brought to operative theater.   Once adequate anesthesia, preoperative antibiotics, 2 gm of Ancef, 1 gm of Tranexamic Acid, and  10 mg of Decadron were administered, the patient was positioned supine on the [employer] table.  Once the patient was safely positioned with adequate padding of boney prominences we predraped out the hip, and used fluoroscopy to confirm orientation of the pelvis.      The right hip was then prepped and draped from proximal iliac crest to   mid thigh with a shower curtain technique.      Time-out was performed identifying the patient, planned procedure, and the appropriate extremity.     An incision was then made 2 cm lateral to the   anterior superior iliac spine extending over the orientation of the   tensor fascia lata muscle and sharp dissection was carried down to the   fascia of the muscle.      The fascia was then incised.  The muscle belly was identified and swept   laterally and retractor placed along the superior neck.  Following   cauterization of the circumflex vessels and removing some pericapsular  fat, a second cobra retractor was placed on the inferior neck.  A T-capsulotomy was made along the line of the   superior neck to the trochanteric fossa, then extended proximally and   distally.  Tag sutures were placed and the retractors were then placed   intracapsular.  We then identified the trochanteric fossa and   orientation of my neck cut and then made a neck osteotomy with the femur on traction.  The femoral   head was removed without difficulty or complication.  Traction was let   off and retractors were placed posterior and anterior around the   acetabulum.      The labrum and foveal tissue were debrided.  I began reaming with a 45 mm   reamer and reamed up to 49 mm reamer with good bony bed preparation and a 50 mm  cup was chosen.  The final 50 mm Pinnacle cup was then impacted under fluoroscopy to confirm the depth of penetration and orientation with respect to   Abduction and forward flexion.  A screw was placed into the ilium followed by the hole eliminator.  The  final   32+4 neutral Altrex liner was impacted with good visualized rim fit.  The cup was positioned anatomically within the acetabular portion of the pelvis.      At this point, the femur was rolled to 100 degrees.  Further capsule was   released off the inferior aspect of the femoral neck.  I then   released the superior capsule proximally.  With the leg in a neutral position the hook was placed laterally   along the femur under the vastus lateralis origin and elevated manually and then held in position using the hook attachment on the bed.  The leg was then extended and adducted with the leg rolled to 100   degrees of external rotation.  Retractors were placed along the medial calcar and posteriorly over the greater trochanter.  Once the proximal femur was fully   exposed, I used a box osteotome to set orientation.  I then began   broaching with the starting chili pepper broach and passed this by hand and then broached up to 4.  With the 4 broach in place I chose a high offset neck and did several trial reductions.  The offset was appropriate, leg lengths   appeared to be equal best matched with the 32+1 head ball trial confirmed radiographically.   Given these findings, I went ahead and dislocated the hip, repositioned all   retractors and positioned the right hip in the extended and abducted position.  The final 4 Hi Actis stem was   chosen and it was impacted down to the level of neck cut.  Based on this   and the trial reductions, a final 32+1 Articuleze metal head ball was chosen and   impacted onto a clean and dry trunnion, and the hip was reduced.  The   hip had been irrigated throughout the case again at this point.  I did   reapproximate the superior capsular leaflet to the anterior leaflet   using #1 Vicryl.  The fascia of the   tensor fascia lata muscle was then reapproximated using #1 Vicryl and #0 Stratafix sutures.  The   remaining wound was closed with 2-0 Vicryl and running  4-0 Monocryl.   The hip was cleaned, dried, and dressed sterilely using Dermabond and   Aquacel dressing.  The patient was then brought   to recovery room  in stable condition tolerating the procedure well.    Danae Orleans, PA-C was present for the entirety of the case involved from   preoperative positioning, perioperative retractor management, general   facilitation of the case, as well as primary wound closure as assistant.            Pietro Cassis Alvan Dame, M.D.        01/13/2019 7:29 AM

## 2019-01-13 NOTE — Anesthesia Postprocedure Evaluation (Signed)
Anesthesia Post Note  Patient: Sharon Fitzgerald  Procedure(s) Performed: RIGHT TOTAL HIP ARTHROPLASTY ANTERIOR APPROACH (Right Hip)     Patient location during evaluation: PACU Anesthesia Type: Spinal Level of consciousness: oriented and awake and alert Pain management: pain level controlled Vital Signs Assessment: post-procedure vital signs reviewed and stable Respiratory status: spontaneous breathing, respiratory function stable and patient connected to nasal cannula oxygen Cardiovascular status: blood pressure returned to baseline and stable Postop Assessment: no headache, no backache and no apparent nausea or vomiting Anesthetic complications: no    Last Vitals:  Vitals:   01/13/19 1215 01/13/19 1248  BP: (!) 165/82 (!) 161/83  Pulse: (!) 50 (!) 57  Resp: 14 14  Temp: 36.7 C 36.7 C  SpO2: 100% 100%    Last Pain:  Vitals:   01/13/19 1248  TempSrc: Oral  PainSc:                  Chelsey L Woodrum

## 2019-01-13 NOTE — Discharge Instructions (Signed)

## 2019-01-14 ENCOUNTER — Encounter (HOSPITAL_COMMUNITY): Payer: Self-pay | Admitting: Orthopedic Surgery

## 2019-01-14 LAB — CBC
HCT: 30.6 % — ABNORMAL LOW (ref 36.0–46.0)
Hemoglobin: 9.2 g/dL — ABNORMAL LOW (ref 12.0–15.0)
MCH: 29.4 pg (ref 26.0–34.0)
MCHC: 30.1 g/dL (ref 30.0–36.0)
MCV: 97.8 fL (ref 80.0–100.0)
Platelets: 247 10*3/uL (ref 150–400)
RBC: 3.13 MIL/uL — ABNORMAL LOW (ref 3.87–5.11)
RDW: 12 % (ref 11.5–15.5)
WBC: 16.6 10*3/uL — ABNORMAL HIGH (ref 4.0–10.5)
nRBC: 0 % (ref 0.0–0.2)

## 2019-01-14 LAB — BASIC METABOLIC PANEL
Anion gap: 8 (ref 5–15)
BUN: 18 mg/dL (ref 8–23)
CO2: 22 mmol/L (ref 22–32)
Calcium: 8.1 mg/dL — ABNORMAL LOW (ref 8.9–10.3)
Chloride: 108 mmol/L (ref 98–111)
Creatinine, Ser: 1.11 mg/dL — ABNORMAL HIGH (ref 0.44–1.00)
GFR calc Af Amer: 56 mL/min — ABNORMAL LOW (ref >60)
GFR calc non Af Amer: 48 mL/min — ABNORMAL LOW (ref >60)
Glucose, Bld: 124 mg/dL — ABNORMAL HIGH (ref 70–99)
Potassium: 3.5 mmol/L (ref 3.5–5.1)
Sodium: 138 mmol/L (ref 135–145)

## 2019-01-14 NOTE — TOC Transition Note (Signed)
Transition of Care [facility]) - CM/SW Discharge Note   Patient Details  Name: REIA VIERNES MRN: [id number] Date of Birth: 1941/06/16  [facility] of [facility]) CM/SW Contact:  Leeroy Cha, RN Phone Number: 01/14/2019, 9:56 AM   Clinical Narrative:    dcd to home   Final next level of care: Home/Self Care Barriers to Discharge: No Barriers Identified   Patient Goals and CMS Choice Patient states their goals for this hospitalization and ongoing recovery are:: to go home CMS Medicare.gov Compare Post Acute Care list provided to:: Patient    Discharge Placement                       Discharge Plan and Services   Discharge Planning Services: CM Consult                                 Social Determinants of Health (SDOH) Interventions     Readmission Risk Interventions No flowsheet data found.

## 2019-01-14 NOTE — Progress Notes (Signed)
     Subjective: 1 Day Post-Op Procedure(s) (LRB): RIGHT TOTAL HIP ARTHROPLASTY ANTERIOR APPROACH (Right)   Patient reports pain as mild, pain controlled. No events throughout the night. States that the hip is doing well and happy with how it feels.  Looking forward to going home.  Discharge home today if she does well with PT.      Objective:   VITALS:   Vitals:   01/14/19 0053 01/14/19 0458  BP: (!) 184/91 (!) 156/76  Pulse: 86 87  Resp: 16 16  Temp: 98.8 F (37.1 C) 98.8 F (37.1 C)  SpO2: 98% 97%    Dorsiflexion/Plantar flexion intact Incision: dressing C/D/I No cellulitis present Compartment soft  LABS Recent Labs    01/14/19 0355  HGB 9.2*  HCT 30.6*  WBC 16.6*  PLT 247    Recent Labs    01/14/19 0355  NA 138  K 3.5  BUN 18  CREATININE 1.11*  GLUCOSE 124*     Assessment/Plan: 1 Day Post-Op Procedure(s) (LRB): RIGHT TOTAL HIP ARTHROPLASTY ANTERIOR APPROACH (Right) Foley cath d/c'ed Celebrex d/c'edlight elevation in creat. Advance diet Up with therapy D/C IV fluids Discharge home Follow up in 2 weeks at [facility] ([place] Follow up with OLIN,Elvia Aydin D in 2 weeks.  Contact information:  [facility]) [address], [place] [phone]    Obese (BMI 30-39.9)  Estimated body mass index is 34.25 kg/m as calculated from the following:   Height as of this encounter: 5\' 3"  (1.6 m).   Weight as of this encounter: 87.7 kg. Patient also counseled that weight may inhibit the healing process Patient counseled that losing weight will help with future health issues      [place] Pugh. Jazper Nikolai   PAC  01/14/2019, 8:29 AM

## 2019-01-14 NOTE — Plan of Care (Signed)
  Problem: Education: Goal: Knowledge of the prescribed therapeutic regimen will improve Outcome: Completed/Met Goal: Understanding of discharge needs will improve Outcome: Completed/Met Goal: Individualized Educational Video(s) Outcome: Completed/Met

## 2019-01-14 NOTE — Progress Notes (Signed)
Physical Therapy Treatment Patient Details Name: Sharon Fitzgerald MRN: [id number] DOB: 22-Mar-1941 Today's Date: 01/14/2019    History of Present Illness Pt s/p R THR and with hx of R TKR and lumbar laminectomy.    PT Comments    Progressing well with mobility. Reviewed/practiced exercises, gait training, and stair training. Issued HEP for pt to perform 2-3x/day. All education completed-okay to d/c from PT standpoint-made RN aware.    Follow Up Recommendations  Follow surgeon's recommendation for DC plan and follow-up therapies(HEP)     Equipment Recommendations  None recommended by PT    Recommendations for Other Services       Precautions / Restrictions Precautions Precautions: Fall Restrictions Weight Bearing Restrictions: No Other Position/Activity Restrictions: WBAT    Mobility  Bed Mobility Overal bed mobility: Needs Assistance Bed Mobility: Supine to Sit     Supine to sit: Min guard     General bed mobility comments: close guard for safety.   Transfers Overall transfer level: Needs assistance Equipment used: Rolling walker (2 wheeled) Transfers: Sit to/from Stand Sit to Stand: Min guard         General transfer comment: close guard for safety. VCs hand placement  Ambulation/Gait Ambulation/Gait assistance: Min guard Gait Distance (Feet): 135 Feet Assistive device: Rolling walker (2 wheeled) Gait Pattern/deviations: Step-through pattern;Decreased stride length     General Gait Details: close guard for safety.    Stairs Stairs: Yes Min guard  Assistive device: 2 rails Number of Stairs: 2 General stair comments: up and over portable stairs. VCs safety, technique, sequence.    Wheelchair Mobility    Modified Rankin (Stroke Patients Only)       Balance Overall balance assessment: Needs assistance           Standing balance-Leahy Scale: Poor                              Cognition Arousal/Alertness: Awake/alert Behavior  During Therapy: WFL for tasks assessed/performed Overall Cognitive Status: Within Functional Limits for tasks assessed                                        Exercises Total Joint Exercises Ankle Circles/Pumps: AROM;Both;10 reps;Supine Quad Sets: AROM;Both;10 reps;Supine Heel Slides: AAROM;Right;10 reps;Supine Hip ABduction/ADduction: AAROM;Right;10 reps;Supine General Exercises - Lower Extremity Heel Raises: AROM;Both;10 reps;Standing    General Comments        Pertinent Vitals/Pain Pain Assessment: 0-10 Pain Score: 5  Pain Location: R hip/thigh Pain Descriptors / Indicators: Aching;Sore Pain Intervention(s): Monitored during session;Repositioned;Ice applied    Home Living                      Prior Function            PT Goals (current goals can now be found in the care plan section) Progress towards PT goals: Progressing toward goals    Frequency    7X/week      PT Plan Current plan remains appropriate    Co-evaluation              AM-PAC PT "6 Clicks" Mobility   Outcome Measure  Help needed turning from your back to your side while in a flat bed without using bedrails?: A Little Help needed moving from lying on your back to sitting on the side of a  flat bed without using bedrails?: A Little Help needed moving to and from a bed to a chair (including a wheelchair)?: A Little Help needed standing up from a chair using your arms (e.g., wheelchair or bedside chair)?: A Little Help needed to walk in hospital room?: A Little Help needed climbing 3-5 steps with a railing? : A Little 6 Click Score: 18    End of Session Equipment Utilized During Treatment: Gait belt Activity Tolerance: Patient tolerated treatment well Patient left: with call bell/phone within reach;in chair   PT Visit Diagnosis: Other abnormalities of gait and mobility (R26.89);Pain Pain - Right/Left: Right Pain - part of body: Hip     Time: 1012-1037 PT  Time Calculation (min) (ACUTE ONLY): 25 min  Charges:  $Gait Training: 8-22 mins $Therapeutic Exercise: 8-22 mins                        Weston Anna, PT Acute Rehabilitation Services Pager: [phone] Office: [phone]

## 2019-01-20 NOTE — Discharge Summary (Signed)
Physician Discharge Summary  Patient ID: Sharon Fitzgerald MRN: [id number] DOB/AGE: 05-Feb-1941 78 y.o.  Admit date: 01/13/2019 Discharge date: 01/14/2019   Procedures:  Procedure(s) (LRB): RIGHT TOTAL HIP ARTHROPLASTY ANTERIOR APPROACH (Right)  Attending Physician:  Dr. Paralee Cancel   Admission Diagnoses:   Right hip primary OA / pain  Discharge Diagnoses:  Principal Problem:   S/P right THA, AA Active Problems:   Obese  Past Medical History:  Diagnosis Date  . Arthritis   . Hypertension   . Spinal stenosis    sees Dr. Rolena Infante    HPI:    Sharon Fitzgerald, 78 y.o. female, has a history of pain and functional disability in the right hip due to arthritis and patient has failed non-surgical conservative treatments for greater than 12 weeks to include NSAID's and/or analgesics, corticosteriod injections, use of assistive devices and activity modification.  Onset of symptoms was gradual starting >10 years ago with gradually worsening course since that time.The patient noted no past surgery on the right hip(s).  Patient currently rates pain in the right hip at 8 out of 10 with activity. Patient has night pain, worsening of pain with activity and weight bearing, trendelenberg gait, pain that interfers with activities of daily living and pain with passive range of motion. Patient has evidence of periarticular osteophytes and joint space narrowing by imaging studies. This condition presents safety issues increasing the risk of falls. There is no current active infection.  Risks, benefits and expectations were discussed with the patient.  Risks including but not limited to the risk of anesthesia, blood clots, nerve damage, blood vessel damage, failure of the prosthesis, infection and up to and including death.  Patient understand the risks, benefits and expectations and wishes to proceed with surgery.   PCP: Sharon Small, MD   Discharged Condition: good  Hospital Course:  Patient underwent the  above stated procedure on 01/13/2019. Patient tolerated the procedure well and brought to the recovery room in good condition and subsequently to the floor.  POD #1 BP: 156/76 ; Pulse: 87 ; Temp: 98.8 F (37.1 C) ; Resp: 16 Patient reports pain as mild, pain controlled. No events throughout the night. States that the hip is doing well and happy with how it feels.  Looking forward to going home.  Discharge home. Dorsiflexion/plantar flexion intact, incision: dressing C/D/I, no cellulitis present and compartment soft.   LABS  Basename    HGB     9.2  HCT     30.6    Discharge Exam: General appearance: alert, cooperative and no distress Extremities: Homans sign is negative, no sign of DVT, no edema, redness or tenderness in the calves or thighs and no ulcers, gangrene or trophic changes  Disposition:  Home with follow up in 2 weeks   Follow-up Information    Paralee Cancel, MD. Schedule an appointment as soon as possible for a visit in 2 weeks.   Specialty: Orthopedic Surgery Contact information: [address] [place] [address] [phone]           Discharge Instructions    Call MD / Call 911   Complete by: As directed    If you experience chest pain or shortness of breath, CALL 911 and be transported to the hospital emergency room.  If you develope a fever above 101 F, pus (white drainage) or increased drainage or redness at the wound, or calf pain, call your surgeon's office.   Change dressing   Complete  by: As directed    Maintain surgical dressing until follow up in the clinic. If the edges start to pull up, may reinforce with tape. If the dressing is no longer working, may remove and cover with gauze and tape, but must keep the area dry and clean.  Call with any questions or concerns.   Constipation Prevention   Complete by: As directed    Drink plenty of fluids.  Prune juice may be helpful.  You may use a stool softener, such as Colace (over the  counter) 100 mg twice a day.  Use MiraLax (over the counter) for constipation as needed.   Diet - low sodium heart healthy   Complete by: As directed    Discharge instructions   Complete by: As directed    Maintain surgical dressing until follow up in the clinic. If the edges start to pull up, may reinforce with tape. If the dressing is no longer working, may remove and cover with gauze and tape, but must keep the area dry and clean.  Follow up in 2 weeks at [facility]. Call with any questions or concerns.   Increase activity slowly as tolerated   Complete by: As directed    Weight bearing as tolerated with assist device (walker, cane, etc) as directed, use it as long as suggested by your surgeon or therapist, typically at least 4-6 weeks.   TED hose   Complete by: As directed    Use stockings (TED hose) for 2 weeks on both leg(s).  You may remove them at night for sleeping.      Allergies as of 01/14/2019   No Known Allergies     Medication List    STOP taking these medications   aspirin 325 MG tablet Replaced by: aspirin 81 MG chewable tablet   traMADol 50 MG tablet Commonly known as: ULTRAM     TAKE these medications   aspirin 81 MG chewable tablet Commonly known as: Aspirin Childrens Chew 1 tablet (81 mg total) by mouth 2 (two) times daily for 30 days. Take for 4 weeks, then resume regular dose. Replaces: aspirin 325 MG tablet   docusate sodium 100 MG capsule Commonly known as: Colace Take 1 capsule (100 mg total) by mouth 2 (two) times daily.   ferrous sulfate 325 (65 FE) MG tablet Commonly known as: FerrouSul Take 1 tablet (325 mg total) by mouth 3 (three) times daily with meals. What changed:   how much to take  when to take this   HYDROcodone-acetaminophen 7.5-325 MG tablet Commonly known as: Norco Take 1-2 tablets by mouth every 4 (four) hours as needed for moderate pain.   lisinopril-hydrochlorothiazide 10-12.5 MG tablet Commonly known as:  ZESTORETIC Take 1 tablet by mouth daily.   methocarbamol 500 MG tablet Commonly known as: Robaxin Take 1 tablet (500 mg total) by mouth every 6 (six) hours as needed for muscle spasms.   polyethylene glycol 17 g packet Commonly known as: MIRALAX / GLYCOLAX Take 17 g by mouth 2 (two) times daily.   pravastatin 20 MG tablet Commonly known as: PRAVACHOL Take 20 mg by mouth daily.   VITAMIN B12 PO Take 3,000 mcg by mouth daily.   Vitamin D3 50 MCG (2000 UT) Tabs Take 2,000 Units by mouth daily.            Discharge Care Instructions  (From admission, onward)         Start     Ordered   01/14/19 0000  Change  dressing    Comments: Maintain surgical dressing until follow up in the clinic. If the edges start to pull up, may reinforce with tape. If the dressing is no longer working, may remove and cover with gauze and tape, but must keep the area dry and clean.  Call with any questions or concerns.   01/14/19 1884           Signed: West Pugh. Gerarda Conklin   PA-C  01/20/2019, 8:10 AM

## 2019-12-20 NOTE — H&P (Signed)
TOTAL HIP ADMISSION H&P  Patient is admitted for left total hip arthroplasty, anterior approach.  Subjective:  Chief Complaint: Left hip primary OA / pain  HPI: Sharon Fitzgerald, 79 y.o. female, has a history of pain and functional disability in the left hip(s) due to arthritis and patient has failed non-surgical conservative treatments for greater than 12 weeks to include NSAID's and/or analgesics, corticosteriod injections, use of assistive devices and activity modification.  Onset of symptoms was gradual starting >10 years ago with gradually worsening course since that time.The patient noted prior procedures of the hip to include arthroplasty on the right hip on January 10, 2019.   Patient currently rates pain in the left hip at 8 out of 10 with activity. Patient has night pain, worsening of pain with activity and weight bearing, trendelenberg gait, pain that interfers with activities of daily living and pain with passive range of motion. Patient has evidence of periarticular osteophytes and joint space narrowing by imaging studies. This condition presents safety issues increasing the risk of falls.   There is no current active infection.  Risks, benefits and expectations were discussed with the patient.  Risks including but not limited to the risk of anesthesia, blood clots, nerve damage, blood vessel damage, failure of the prosthesis, infection and up to and including death.  Patient understand the risks, benefits and expectations and wishes to proceed with surgery.   PCP: Maurice Small, MD  D/C Plans:       Home  Post-op Meds:       No Rx given  Tranexamic Acid:      To be given - IV   Decadron:      Is to be given  FYI:      ASA  Norco  DME:   Pt already has equipment  PT:   [facility]  Pharmacy: [facility] - [address], [place], [place]    Patient Active Problem List   Diagnosis Date Noted  . S/P right THA, AA 01/13/2019  . Lumbosacral radiculopathy 12/20/2014  . Carpal tunnel syndrome  of right wrist 12/20/2014  . Muscle weakness 06/30/2014  . Spinal stenosis, lumbar region, with neurogenic claudication 06/30/2014  . Essential hypertension 06/30/2014  . Hyperlipidemia 06/30/2014  . Expected blood loss anemia 01/16/2013  . Obese 01/16/2013  . S/P right TKA 01/15/2013   Past Medical History:  Diagnosis Date  . Arthritis   . Hypertension   . Spinal stenosis    sees Dr. Rolena Infante    Past Surgical History:  Procedure Laterality Date  . BREAST SURGERY Bilateral    benign fibroid cyst  . COLONOSCOPY    . JOINT REPLACEMENT Right   . left hand surgury from fracture    . LUMBAR LAMINECTOMY/DECOMPRESSION MICRODISCECTOMY N/A 04/30/2013   Procedure: L3-S1 DECOMPRESSION, L4-S1 INSTRUMENTED POSTERIOR SPINAL FUSION;  Surgeon: Melina Schools, MD;  Location: [facility];  Service: Orthopedics;  Laterality: N/A;  . TOTAL HIP ARTHROPLASTY Right 01/13/2019   Procedure: RIGHT TOTAL HIP ARTHROPLASTY ANTERIOR APPROACH;  Surgeon: Paralee Cancel, MD;  Location: [facility];  Service: Orthopedics;  Laterality: Right;  . TOTAL KNEE ARTHROPLASTY Right 01/15/2013   Procedure: RIGHT TOTAL KNEE ARTHROPLASTY;  Surgeon: [name], MD;  Location: [facility];  Service: Orthopedics;  Laterality: Right;  . TUBAL LIGATION      No current facility-administered medications for this encounter.   Current Outpatient Medications  Medication Sig Dispense Refill Last Dose  . aspirin EC 325 MG tablet Take 650 mg by mouth  daily as needed (arthritis pain.).     Marland Kitchen cholecalciferol (VITAMIN D) 25 MCG (1000 UNIT) tablet Take 1,000 Units by mouth daily.     . Cyanocobalamin (VITAMIN B-12) 3000 MCG SUBL Take 6,000 mcg by mouth daily.     . ferrous sulfate (FERROUSUL) 325 (65 FE) MG tablet Take 1 tablet (325 mg total) by mouth 3 (three) times daily with meals. (Patient taking differently: Take 650 mg by mouth daily. )  3   . lisinopril-hydrochlorothiazide (PRINZIDE,ZESTORETIC) 10-12.5 MG per tablet Take 1 tablet by mouth daily.      . pravastatin (PRAVACHOL) 20 MG tablet Take 20 mg by mouth daily.   11   . traMADol (ULTRAM) 50 MG tablet Take 50-100 mg by mouth 2 (two) times daily as needed for pain.      No Known Allergies  Social History   Tobacco Use  . Smoking status: Never Smoker  . Smokeless tobacco: Never Used  Substance Use Topics  . Alcohol use: Yes    Alcohol/week: 5.0 standard drinks    Types: 5 Glasses of wine per week    Comment: very rare    Family History  Problem Relation Age of Onset  . Cancer Mother   . Hypertension Mother   . Heart disease Father      Review of Systems  Constitutional: Negative.   HENT: Negative.   Eyes: Negative.   Respiratory: Negative.   Cardiovascular: Negative.   Gastrointestinal: Negative.   Genitourinary: Negative.   Musculoskeletal: Positive for back pain and joint pain.  Skin: Negative.   Neurological: Negative.   Endo/Heme/Allergies: Negative.   Psychiatric/Behavioral: Negative.      Objective:  Physical Exam  Constitutional: She is oriented to person, place, and time. She appears well-developed.  HENT:  Head: Normocephalic.  Eyes: Pupils are equal, round, and reactive to light.  Neck: No JVD present. No tracheal deviation present. No thyromegaly present.  Cardiovascular: Normal rate, regular rhythm and intact distal pulses.  Respiratory: Effort normal and breath sounds normal. No respiratory distress. She has no wheezes.  GI: Soft. There is no abdominal tenderness. There is no guarding.  Musculoskeletal:     Cervical back: Neck supple.     Left hip: Tenderness and bony tenderness present. No swelling, deformity or lacerations. Decreased range of motion. Decreased strength.  Lymphadenopathy:    She has no cervical adenopathy.  Neurological: She is alert and oriented to person, place, and time.  Skin: Skin is warm and dry.  Psychiatric: She has a normal mood and affect.      Labs:  Estimated body mass index is 34.25 kg/m as calculated  from the following:   Height as of 01/13/19: 5\' 3"  (1.6 m).   Weight as of 01/13/19: 87.7 kg.   Imaging Review Plain radiographs demonstrate severe degenerative joint disease of the left hip(s). The bone quality appears to be good for age and reported activity level.      Assessment/Plan:  End stage arthritis, left hip(s)  The patient history, physical examination, clinical judgement of the provider and imaging studies are consistent with end stage degenerative joint disease of the left hip(s) and total hip arthroplasty is deemed medically necessary. The treatment options including medical management, injection therapy, arthroscopy and arthroplasty were discussed at length. The risks and benefits of total hip arthroplasty were presented and reviewed. The risks due to aseptic loosening, infection, stiffness, dislocation/subluxation,  thromboembolic complications and other imponderables were discussed.  The patient acknowledged the explanation, agreed  to proceed with the plan and consent was signed. Patient is being admitted for inpatient treatment for surgery, pain control, PT, OT, prophylactic antibiotics, VTE prophylaxis, progressive ambulation and ADL's and discharge planning.The patient is planning to be discharged home.    West Pugh Kourtlyn Charlet   PA-C  12/20/2019, 5:07 PM

## 2019-12-21 ENCOUNTER — Encounter (HOSPITAL_COMMUNITY): Payer: Self-pay

## 2019-12-21 NOTE — Patient Instructions (Addendum)
DUE TO COVID-19 ONLY ONE VISITOR ARE ALLOWED TO COME WITH YOU AND STAY IN THE WAITING ROOM ONLY DURING PRE OP AND PROCEDURE. THEN TWO VISITORS MAY VISIT WITH YOU IN YOUR PRIVATE ROOM DURING VISITING HOURS ONLY!!   COVID SWAB TESTING MUST BE COMPLETED ON:  Friday, Dec 25, 2019  At 11:15 AM [address], [place] -[facility] enter pre surgical testing line (Must self quarantine after testing. Follow instructions on handout.)             Your procedure is scheduled on: Tuesday, Dec 29, 2019   Report to [facility] Main  Entrance    Report to admitting at 6:10 AM   Call this number if you have problems the morning of surgery [phone]   Do not eat food :After Midnight.   May have liquids until 5:30AM day of surgery   CLEAR LIQUID DIET  Foods Allowed                                                                     Foods Excluded  Water, Black Coffee and tea, regular and decaf                             liquids that you cannot  Plain Jell-O in any flavor  (No red)                                           see through such as: Fruit ices (not with fruit pulp)                                     milk, soups, orange juice  Iced Popsicles (No red)                                    All solid food Carbonated beverages, regular and diet                                    Apple juices Sports drinks like Gatorade (No red) Lightly seasoned clear broth or consume(fat free) Sugar, honey syrup  Sample Menu Breakfast                                Lunch                                     Supper Cranberry juice                    Beef broth                            Chicken broth Jell-O  Grape juice                           Apple juice Coffee or tea                        Jell-O                                      Popsicle                                                Coffee or tea                        Coffee or  tea   Complete one Ensure drink the morning of surgery at 5:30AM the day of surgery.   Oral Hygiene is also important to reduce your risk of infection.                                    Remember - BRUSH YOUR TEETH THE MORNING OF SURGERY WITH YOUR REGULAR TOOTHPASTE   Do NOT smoke after Midnight   Take these medicines the morning of surgery with A SIP OF WATER: Pravastatin                               You may not have any metal on your body including hair pins, jewelry, and body piercings             Do not wear make-up, lotions, powders, perfumes/cologne, or deodorant             Do not wear nail polish.  Do not shave  48 hours prior to surgery.                Do not bring valuables to the hospital. [place].   Contacts, dentures or bridgework may not be worn into surgery.   Bring small overnight bag day of surgery.    Patients discharged the day of surgery will not be allowed to drive home.   Special Instructions: Bring a copy of your healthcare power of attorney and living will documents         the day of surgery if you haven't scanned them in before.              Please read over the following fact sheets you were given: IF YOU HAVE QUESTIONS ABOUT YOUR PRE OP INSTRUCTIONS PLEASE CALL [phone]   [facility] - Preparing for Surgery Before surgery, you can play an important role.  Because skin is not sterile, your skin needs to be as free of germs as possible.  You can reduce the number of germs on your skin by washing with CHG (chlorahexidine gluconate) soap before surgery.  CHG is an antiseptic cleaner which kills germs and bonds with the skin to continue killing germs even after washing. Please DO NOT use if you have an allergy to  CHG or antibacterial soaps.  If your skin becomes reddened/irritated stop using the CHG and inform your nurse when you arrive at Short Stay. Do not shave (including legs and underarms) for at least  48 hours prior to the first CHG shower.  You may shave your face/neck.  Please follow these instructions carefully:  1.  Shower with CHG Soap the night before surgery and the  morning of surgery.  2.  If you choose to wash your hair, wash your hair first as usual with your normal  shampoo.  3.  After you shampoo, rinse your hair and body thoroughly to remove the shampoo.                             4.  Use CHG as you would any other liquid soap.  You can apply chg directly to the skin and wash.  Gently with a scrungie or clean washcloth.  5.  Apply the CHG Soap to your body ONLY FROM THE NECK DOWN.   Do   not use on face/ open                           Wound or open sores. Avoid contact with eyes, ears mouth and   genitals (private parts).                       Wash face,  Genitals (private parts) with your normal soap.             6.  Wash thoroughly, paying special attention to the area where your    surgery  will be performed.  7.  Thoroughly rinse your body with warm water from the neck down.  8.  DO NOT shower/wash with your normal soap after using and rinsing off the CHG Soap.                9.  Pat yourself dry with a clean towel.            10.  Wear clean pajamas.            11.  Place clean sheets on your bed the night of your first shower and do not  sleep with pets. Day of Surgery : Do not apply any lotions/deodorants the morning of surgery.  Please wear clean clothes to the hospital/surgery center.  FAILURE TO FOLLOW THESE INSTRUCTIONS MAY RESULT IN THE CANCELLATION OF YOUR SURGERY  PATIENT SIGNATURE_________________________________  NURSE SIGNATURE__________________________________  ________________________________________________________________________   Adam Phenix  An incentive spirometer is a tool that can help keep your lungs clear and active. This tool measures how well you are filling your lungs with each breath. Taking long deep breaths may help reverse  or decrease the chance of developing breathing (pulmonary) problems (especially infection) following:  A long period of time when you are unable to move or be active. BEFORE THE PROCEDURE   If the spirometer includes an indicator to show your best effort, your nurse or respiratory therapist will set it to a desired goal.  If possible, sit up straight or lean slightly forward. Try not to slouch.  Hold the incentive spirometer in an upright position. INSTRUCTIONS FOR USE  1. Sit on the edge of your bed if possible, or sit up as far as you can in bed or on a chair. 2. Hold the incentive spirometer in an  upright position. 3. Breathe out normally. 4. Place the mouthpiece in your mouth and seal your lips tightly around it. 5. Breathe in slowly and as deeply as possible, raising the piston or the ball toward the top of the column. 6. Hold your breath for 3-5 seconds or for as long as possible. Allow the piston or ball to fall to the bottom of the column. 7. Remove the mouthpiece from your mouth and breathe out normally. 8. Rest for a few seconds and repeat Steps 1 through 7 at least 10 times every 1-2 hours when you are awake. Take your time and take a few normal breaths between deep breaths. 9. The spirometer may include an indicator to show your best effort. Use the indicator as a goal to work toward during each repetition. 10. After each set of 10 deep breaths, practice coughing to be sure your lungs are clear. If you have an incision (the cut made at the time of surgery), support your incision when coughing by placing a pillow or rolled up towels firmly against it. Once you are able to get out of bed, walk around indoors and cough well. You may stop using the incentive spirometer when instructed by your caregiver.  RISKS AND COMPLICATIONS  Take your time so you do not get dizzy or light-headed.  If you are in pain, you may need to take or ask for pain medication before doing incentive  spirometry. It is harder to take a deep breath if you are having pain. AFTER USE  Rest and breathe slowly and easily.  It can be helpful to keep track of a log of your progress. Your caregiver can provide you with a simple table to help with this. If you are using the spirometer at home, follow these instructions: [place] IF:   You are having difficultly using the spirometer.  You have trouble using the spirometer as often as instructed.  Your pain medication is not giving enough relief while using the spirometer.  You develop fever of 100.5 F (38.1 C) or higher. SEEK IMMEDIATE MEDICAL CARE IF:   You cough up bloody sputum that had not been present before.  You develop fever of 102 F (38.9 C) or greater.  You develop worsening pain at or near the incision site. MAKE SURE YOU:   Understand these instructions.  Will watch your condition.  Will get help right away if you are not doing well or get worse. Document Released: 12/03/2006 Document Revised: 10/15/2011 Document Reviewed: 02/03/2007 ExitCare Patient Information 2014 ExitCare, [place].   ________________________________________________________________________  WHAT IS A BLOOD TRANSFUSION? Blood Transfusion Information  A transfusion is the replacement of blood or some of its parts. Blood is made up of multiple cells which provide different functions.  Red blood cells carry oxygen and are used for blood loss replacement.  White blood cells fight against infection.  Platelets control bleeding.  Plasma helps clot blood.  Other blood products are available for specialized needs, such as hemophilia or other clotting disorders. BEFORE THE TRANSFUSION  Who gives blood for transfusions?   Healthy volunteers who are fully evaluated to make sure their blood is safe. This is blood bank blood. Transfusion therapy is the safest it has ever been in the practice of medicine. Before blood is taken from a donor, a  complete history is taken to make sure that person has no history of diseases nor engages in risky social behavior (examples are intravenous drug use or sexual activity with  multiple partners). The donor's travel history is screened to minimize risk of transmitting infections, such as malaria. The donated blood is tested for signs of infectious diseases, such as HIV and hepatitis. The blood is then tested to be sure it is compatible with you in order to minimize the chance of a transfusion reaction. If you or a relative donates blood, this is often done in anticipation of surgery and is not appropriate for emergency situations. It takes many days to process the donated blood. RISKS AND COMPLICATIONS Although transfusion therapy is very safe and saves many lives, the main dangers of transfusion include:   Getting an infectious disease.  Developing a transfusion reaction. This is an allergic reaction to something in the blood you were given. Every precaution is taken to prevent this. The decision to have a blood transfusion has been considered carefully by your caregiver before blood is given. Blood is not given unless the benefits outweigh the risks. AFTER THE TRANSFUSION  Right after receiving a blood transfusion, you will usually feel much better and more energetic. This is especially true if your red blood cells have gotten low (anemic). The transfusion raises the level of the red blood cells which carry oxygen, and this usually causes an energy increase.  The nurse administering the transfusion will monitor you carefully for complications. HOME CARE INSTRUCTIONS  No special instructions are needed after a transfusion. You may find your energy is better. Speak with your caregiver about any limitations on activity for underlying diseases you may have. SEEK MEDICAL CARE IF:   Your condition is not improving after your transfusion.  You develop redness or irritation at the intravenous (IV)  site. SEEK IMMEDIATE MEDICAL CARE IF:  Any of the following symptoms occur over the next 12 hours:  Shaking chills.  You have a temperature by mouth above 102 F (38.9 C), not controlled by medicine.  Chest, back, or muscle pain.  People around you feel you are not acting correctly or are confused.  Shortness of breath or difficulty breathing.  Dizziness and fainting.  You get a rash or develop hives.  You have a decrease in urine output.  Your urine turns a dark color or changes to pink, red, or brown. Any of the following symptoms occur over the next 10 days:  You have a temperature by mouth above 102 F (38.9 C), not controlled by medicine.  Shortness of breath.  Weakness after normal activity.  The white part of the eye turns yellow (jaundice).  You have a decrease in the amount of urine or are urinating less often.  Your urine turns a dark color or changes to pink, red, or brown. Document Released: 07/20/2000 Document Revised: 10/15/2011 Document Reviewed: 03/08/2008 [facility] Patient Information 2014 [place], [place].  _______________________________________________________________________

## 2019-12-22 DIAGNOSIS — R350 Frequency of micturition: Secondary | ICD-10-CM | POA: Diagnosis not present

## 2019-12-22 DIAGNOSIS — N3941 Urge incontinence: Secondary | ICD-10-CM | POA: Diagnosis not present

## 2019-12-22 DIAGNOSIS — M25859 Other specified joint disorders, unspecified hip: Secondary | ICD-10-CM | POA: Diagnosis not present

## 2019-12-22 DIAGNOSIS — Z01818 Encounter for other preprocedural examination: Secondary | ICD-10-CM | POA: Diagnosis not present

## 2019-12-23 ENCOUNTER — Encounter (HOSPITAL_COMMUNITY): Payer: Self-pay

## 2019-12-23 ENCOUNTER — Other Ambulatory Visit: Payer: Self-pay

## 2019-12-23 ENCOUNTER — Encounter (HOSPITAL_COMMUNITY)
Admission: RE | Admit: 2019-12-23 | Discharge: 2019-12-23 | Disposition: A | Discharge status: Home / Self Care | Payer: Medicare PPO | Source: Ambulatory Visit | Attending: Orthopedic Surgery | Admitting: Orthopedic Surgery

## 2019-12-23 DIAGNOSIS — I10 Essential (primary) hypertension: Secondary | ICD-10-CM | POA: Insufficient documentation

## 2019-12-23 DIAGNOSIS — E785 Hyperlipidemia, unspecified: Secondary | ICD-10-CM | POA: Insufficient documentation

## 2019-12-23 DIAGNOSIS — Z7982 Long term (current) use of aspirin: Secondary | ICD-10-CM | POA: Insufficient documentation

## 2019-12-23 DIAGNOSIS — Z01812 Encounter for preprocedural laboratory examination: Secondary | ICD-10-CM | POA: Insufficient documentation

## 2019-12-23 DIAGNOSIS — M1612 Unilateral primary osteoarthritis, left hip: Secondary | ICD-10-CM | POA: Insufficient documentation

## 2019-12-23 DIAGNOSIS — Z79899 Other long term (current) drug therapy: Secondary | ICD-10-CM | POA: Insufficient documentation

## 2019-12-23 HISTORY — DX: Obesity, unspecified: E66.9

## 2019-12-23 HISTORY — DX: Hyperlipidemia, unspecified: E78.5

## 2019-12-23 HISTORY — DX: Chronic kidney disease, unspecified: N18.9

## 2019-12-23 HISTORY — DX: Anemia, unspecified: D64.9

## 2019-12-23 HISTORY — DX: Carpal tunnel syndrome, right upper limb: G56.01

## 2019-12-23 LAB — BASIC METABOLIC PANEL
Anion gap: 10 (ref 5–15)
BUN: 23 mg/dL (ref 8–23)
CO2: 27 mmol/L (ref 22–32)
Calcium: 9.3 mg/dL (ref 8.9–10.3)
Chloride: 103 mmol/L (ref 98–111)
Creatinine, Ser: 1.21 mg/dL — ABNORMAL HIGH (ref 0.44–1.00)
GFR calc Af Amer: 50 mL/min — ABNORMAL LOW (ref >60)
GFR calc non Af Amer: 43 mL/min — ABNORMAL LOW (ref >60)
Glucose, Bld: 105 mg/dL — ABNORMAL HIGH (ref 70–99)
Potassium: 3.5 mmol/L (ref 3.5–5.1)
Sodium: 140 mmol/L (ref 135–145)

## 2019-12-23 LAB — CBC
HCT: 40.5 % (ref 36.0–46.0)
Hemoglobin: 12.4 g/dL (ref 12.0–15.0)
MCH: 29.5 pg (ref 26.0–34.0)
MCHC: 30.6 g/dL (ref 30.0–36.0)
MCV: 96.4 fL (ref 80.0–100.0)
Platelets: 308 10*3/uL (ref 150–400)
RBC: 4.2 MIL/uL (ref 3.87–5.11)
RDW: 11.9 % (ref 11.5–15.5)
WBC: 9.6 10*3/uL (ref 4.0–10.5)
nRBC: 0 % (ref 0.0–0.2)

## 2019-12-23 LAB — SURGICAL PCR SCREEN
MRSA, PCR: NEGATIVE
Staphylococcus aureus: NEGATIVE

## 2019-12-23 NOTE — Progress Notes (Signed)
PCP - Dr. Beckie Salts last office visit 12/22/19 Cardiologist - N/A  Chest x-ray - N/A EKG - 12/30/2018 on chart Stress Test - N/A ECHO - N/A Cardiac Cath - N/A  Sleep Study - N/A CPAP - N/A  Fasting Blood Sugar - N/A Checks Blood Sugar __N/A___ times a day  Blood Thinner Instructions:  N/A Aspirin Instructions: Yes Last Dose: 12/20/19  Anesthesia review: N/A  Patient denies shortness of breath, fever, cough and chest pain at PAT appointment   Patient verbalized understanding of instructions that were given to them at the PAT appointment. Patient was also instructed that they will need to review over the PAT instructions again at home before surgery.

## 2019-12-25 ENCOUNTER — Other Ambulatory Visit (HOSPITAL_COMMUNITY)
Admission: RE | Admit: 2019-12-25 | Discharge: 2019-12-25 | Disposition: A | Discharge status: Home / Self Care | Payer: Medicare PPO | Source: Ambulatory Visit | Attending: Orthopedic Surgery | Admitting: Orthopedic Surgery

## 2019-12-25 DIAGNOSIS — Z20822 Contact with and (suspected) exposure to covid-19: Secondary | ICD-10-CM | POA: Insufficient documentation

## 2019-12-25 DIAGNOSIS — Z01812 Encounter for preprocedural laboratory examination: Secondary | ICD-10-CM | POA: Diagnosis not present

## 2019-12-25 LAB — SARS CORONAVIRUS 2 (TAT 6-24 HRS): SARS Coronavirus 2: NEGATIVE

## 2019-12-28 NOTE — Anesthesia Preprocedure Evaluation (Addendum)
Anesthesia Evaluation  Patient identified by MRN, date of birth, ID band Patient awake    Reviewed: Allergy & Precautions, NPO status , Patient's Chart, lab work & pertinent test results  History of Anesthesia Complications Negative for: history of anesthetic complications  Airway Mallampati: II  TM Distance: >3 FB Neck ROM: Full    Dental no notable dental hx. (+) Dental Advisory Given   Pulmonary neg pulmonary ROS,    Pulmonary exam normal        Cardiovascular hypertension, Pt. on medications Normal cardiovascular exam     Neuro/Psych negative neurological ROS  negative psych ROS   GI/Hepatic negative GI ROS, Neg liver ROS,   Endo/Other  negative endocrine ROS  Renal/GU negative Renal ROS  negative genitourinary   Musculoskeletal  (+) Arthritis ,   Abdominal   Peds  Hematology negative hematology ROS (+)   Anesthesia Other Findings   Reproductive/Obstetrics                           Anesthesia Physical  Anesthesia Plan  ASA: II  Anesthesia Plan: Spinal   Post-op Pain Management:    Induction:   PONV Risk Score and Plan: 2 and Ondansetron and Propofol infusion  Airway Management Planned: Natural Airway  Additional Equipment:   Intra-op Plan:   Post-operative Plan:   Informed Consent: I have reviewed the patients History and Physical, chart, labs and discussed the procedure including the risks, benefits and alternatives for the proposed anesthesia with the patient or authorized representative who has indicated his/her understanding and acceptance.     Dental advisory given  Plan Discussed with: CRNA and Anesthesiologist  Anesthesia Plan Comments:        Anesthesia Quick Evaluation

## 2019-12-29 ENCOUNTER — Observation Stay (HOSPITAL_COMMUNITY)
Admission: RE | Admit: 2019-12-29 | Discharge: 2019-12-30 | Disposition: A | Discharge status: Home / Self Care | Payer: Medicare PPO | Source: Ambulatory Visit | Attending: Orthopedic Surgery | Admitting: Orthopedic Surgery

## 2019-12-29 ENCOUNTER — Ambulatory Visit (HOSPITAL_COMMUNITY): Payer: Medicare PPO

## 2019-12-29 ENCOUNTER — Encounter (HOSPITAL_COMMUNITY): Payer: Self-pay | Admitting: Orthopedic Surgery

## 2019-12-29 ENCOUNTER — Other Ambulatory Visit: Payer: Self-pay

## 2019-12-29 ENCOUNTER — Ambulatory Visit (HOSPITAL_COMMUNITY): Payer: Medicare PPO | Admitting: Anesthesiology

## 2019-12-29 ENCOUNTER — Observation Stay (HOSPITAL_COMMUNITY): Payer: Medicare PPO

## 2019-12-29 ENCOUNTER — Encounter (HOSPITAL_COMMUNITY)
Admission: RE | Disposition: A | Discharge status: Home / Self Care | Payer: Self-pay | Source: Ambulatory Visit | Attending: Orthopedic Surgery

## 2019-12-29 DIAGNOSIS — Z96649 Presence of unspecified artificial hip joint: Secondary | ICD-10-CM

## 2019-12-29 DIAGNOSIS — E785 Hyperlipidemia, unspecified: Secondary | ICD-10-CM | POA: Diagnosis not present

## 2019-12-29 DIAGNOSIS — Z6833 Body mass index (BMI) 33.0-33.9, adult: Secondary | ICD-10-CM | POA: Insufficient documentation

## 2019-12-29 DIAGNOSIS — I10 Essential (primary) hypertension: Secondary | ICD-10-CM | POA: Diagnosis not present

## 2019-12-29 DIAGNOSIS — Z7982 Long term (current) use of aspirin: Secondary | ICD-10-CM | POA: Insufficient documentation

## 2019-12-29 DIAGNOSIS — I129 Hypertensive chronic kidney disease with stage 1 through stage 4 chronic kidney disease, or unspecified chronic kidney disease: Secondary | ICD-10-CM | POA: Insufficient documentation

## 2019-12-29 DIAGNOSIS — Z79899 Other long term (current) drug therapy: Secondary | ICD-10-CM | POA: Diagnosis not present

## 2019-12-29 DIAGNOSIS — E669 Obesity, unspecified: Secondary | ICD-10-CM | POA: Diagnosis present

## 2019-12-29 DIAGNOSIS — E876 Hypokalemia: Secondary | ICD-10-CM | POA: Diagnosis not present

## 2019-12-29 DIAGNOSIS — M1612 Unilateral primary osteoarthritis, left hip: Secondary | ICD-10-CM | POA: Diagnosis not present

## 2019-12-29 DIAGNOSIS — Z471 Aftercare following joint replacement surgery: Secondary | ICD-10-CM | POA: Diagnosis not present

## 2019-12-29 DIAGNOSIS — Z419 Encounter for procedure for purposes other than remedying health state, unspecified: Secondary | ICD-10-CM

## 2019-12-29 DIAGNOSIS — N189 Chronic kidney disease, unspecified: Secondary | ICD-10-CM | POA: Insufficient documentation

## 2019-12-29 DIAGNOSIS — Z96642 Presence of left artificial hip joint: Secondary | ICD-10-CM

## 2019-12-29 HISTORY — PX: TOTAL HIP ARTHROPLASTY: SHX124

## 2019-12-29 LAB — TYPE AND SCREEN
ABO/RH(D): A POS
Antibody Screen: NEGATIVE

## 2019-12-29 SURGERY — ARTHROPLASTY, HIP, TOTAL, ANTERIOR APPROACH
Anesthesia: Spinal | Site: Hip | Laterality: Left

## 2019-12-29 MED ORDER — TRANEXAMIC ACID-NACL 1000-0.7 MG/100ML-% IV SOLN
1000.0000 mg | INTRAVENOUS | Status: AC
  Administered 2019-12-29: 1000 mg via INTRAVENOUS
  Filled 2019-12-29: qty 100

## 2019-12-29 MED ORDER — PROPOFOL 10 MG/ML IV BOLUS
INTRAVENOUS | Status: AC
  Filled 2019-12-29: qty 20

## 2019-12-29 MED ORDER — PROPOFOL 500 MG/50ML IV EMUL
INTRAVENOUS | Status: DC | PRN
  Administered 2019-12-29: 50 ug/kg/min via INTRAVENOUS

## 2019-12-29 MED ORDER — PROPOFOL 500 MG/50ML IV EMUL
INTRAVENOUS | Status: AC
  Filled 2019-12-29: qty 50

## 2019-12-29 MED ORDER — METHOCARBAMOL 500 MG PO TABS
500.0000 mg | ORAL_TABLET | Freq: Four times a day (QID) | ORAL | Status: DC | PRN
  Administered 2019-12-29 – 2019-12-30 (×2): 500 mg via ORAL
  Filled 2019-12-29 (×2): qty 1

## 2019-12-29 MED ORDER — STERILE WATER FOR IRRIGATION IR SOLN
Status: DC | PRN
  Administered 2019-12-29: 2000 mL

## 2019-12-29 MED ORDER — ONDANSETRON HCL 4 MG PO TABS: 4.0000 mg | ORAL_TABLET | Freq: Four times a day (QID) | ORAL | Status: DC | PRN

## 2019-12-29 MED ORDER — ASPIRIN 81 MG PO CHEW
81.0000 mg | CHEWABLE_TABLET | Freq: Two times a day (BID) | ORAL | Status: DC
  Administered 2019-12-29 – 2019-12-30 (×2): 81 mg via ORAL
  Filled 2019-12-29 (×2): qty 1

## 2019-12-29 MED ORDER — PHENYLEPHRINE 40 MCG/ML (10ML) SYRINGE FOR IV PUSH (FOR BLOOD PRESSURE SUPPORT)
PREFILLED_SYRINGE | INTRAVENOUS | Status: AC
  Filled 2019-12-29: qty 10

## 2019-12-29 MED ORDER — SODIUM CHLORIDE 0.9 % IV SOLN: INTRAVENOUS | Status: DC

## 2019-12-29 MED ORDER — HYDROCODONE-ACETAMINOPHEN 7.5-325 MG PO TABS: 1.0000 | ORAL_TABLET | ORAL | Status: DC | PRN

## 2019-12-29 MED ORDER — FERROUS SULFATE 325 (65 FE) MG PO TABS
325.0000 mg | ORAL_TABLET | Freq: Three times a day (TID) | ORAL | Status: DC
  Administered 2019-12-29 – 2019-12-30 (×2): 325 mg via ORAL
  Filled 2019-12-29 (×2): qty 1

## 2019-12-29 MED ORDER — BUPIVACAINE IN DEXTROSE 0.75-8.25 % IT SOLN
INTRATHECAL | Status: DC | PRN
Start: 2019-12-29 — End: 2019-12-29
  Administered 2019-12-29: 1.6 mL via INTRATHECAL

## 2019-12-29 MED ORDER — EPHEDRINE SULFATE-NACL 50-0.9 MG/10ML-% IV SOSY
PREFILLED_SYRINGE | INTRAVENOUS | Status: DC | PRN
  Administered 2019-12-29 (×2): 10 mg via INTRAVENOUS

## 2019-12-29 MED ORDER — HYDROCODONE-ACETAMINOPHEN 5-325 MG PO TABS
1.0000 | ORAL_TABLET | ORAL | Status: DC | PRN
  Administered 2019-12-29 (×2): 2 via ORAL
  Administered 2019-12-29 – 2019-12-30 (×2): 1 via ORAL
  Filled 2019-12-29: qty 2
  Filled 2019-12-29 (×2): qty 1
  Filled 2019-12-29: qty 2

## 2019-12-29 MED ORDER — POLYETHYLENE GLYCOL 3350 17 G PO PACK
17.0000 g | PACK | Freq: Two times a day (BID) | ORAL | Status: DC
  Administered 2019-12-29 – 2019-12-30 (×2): 17 g via ORAL
  Filled 2019-12-29 (×2): qty 1

## 2019-12-29 MED ORDER — ACETAMINOPHEN 325 MG PO TABS: 325.0000 mg | ORAL_TABLET | Freq: Four times a day (QID) | ORAL | Status: DC | PRN

## 2019-12-29 MED ORDER — DEXAMETHASONE SODIUM PHOSPHATE 10 MG/ML IJ SOLN: 10.0000 mg | Freq: Once | INTRAMUSCULAR | Status: DC

## 2019-12-29 MED ORDER — TRANEXAMIC ACID-NACL 1000-0.7 MG/100ML-% IV SOLN
1000.0000 mg | Freq: Once | INTRAVENOUS | Status: AC
  Administered 2019-12-29: 1000 mg via INTRAVENOUS
  Filled 2019-12-29: qty 100

## 2019-12-29 MED ORDER — ALUM & MAG HYDROXIDE-SIMETH 200-200-20 MG/5ML PO SUSP: 15.0000 mL | ORAL | Status: DC | PRN

## 2019-12-29 MED ORDER — PROPOFOL 10 MG/ML IV BOLUS
INTRAVENOUS | Status: DC | PRN
  Administered 2019-12-29: 30 mg via INTRAVENOUS
  Administered 2019-12-29 (×2): 20 mg via INTRAVENOUS

## 2019-12-29 MED ORDER — FENTANYL CITRATE (PF) 100 MCG/2ML IJ SOLN
INTRAMUSCULAR | Status: DC | PRN
  Administered 2019-12-29: 100 ug via INTRAVENOUS

## 2019-12-29 MED ORDER — PRAVASTATIN SODIUM 20 MG PO TABS
20.0000 mg | ORAL_TABLET | Freq: Every day | ORAL | Status: DC
  Administered 2019-12-30: 20 mg via ORAL
  Filled 2019-12-29: qty 1

## 2019-12-29 MED ORDER — BISACODYL 10 MG RE SUPP: 10.0000 mg | Freq: Every day | RECTAL | Status: DC | PRN

## 2019-12-29 MED ORDER — ONDANSETRON HCL 4 MG/2ML IJ SOLN
INTRAMUSCULAR | Status: AC
  Filled 2019-12-29: qty 2

## 2019-12-29 MED ORDER — FENTANYL CITRATE (PF) 100 MCG/2ML IJ SOLN
INTRAMUSCULAR | Status: AC
  Filled 2019-12-29: qty 2

## 2019-12-29 MED ORDER — DEXAMETHASONE SODIUM PHOSPHATE 10 MG/ML IJ SOLN
INTRAMUSCULAR | Status: DC | PRN
  Administered 2019-12-29: 10 mg via INTRAVENOUS

## 2019-12-29 MED ORDER — METHOCARBAMOL 500 MG IVPB - SIMPLE MED
500.0000 mg | Freq: Four times a day (QID) | INTRAVENOUS | Status: DC | PRN
  Filled 2019-12-29: qty 50

## 2019-12-29 MED ORDER — PROMETHAZINE HCL 25 MG/ML IJ SOLN: 6.2500 mg | INTRAMUSCULAR | Status: DC | PRN

## 2019-12-29 MED ORDER — DOCUSATE SODIUM 100 MG PO CAPS
100.0000 mg | ORAL_CAPSULE | Freq: Two times a day (BID) | ORAL | Status: DC
  Administered 2019-12-29 – 2019-12-30 (×3): 100 mg via ORAL
  Filled 2019-12-29 (×3): qty 1

## 2019-12-29 MED ORDER — EPHEDRINE 5 MG/ML INJ
INTRAVENOUS | Status: AC
  Filled 2019-12-29: qty 10

## 2019-12-29 MED ORDER — PHENOL 1.4 % MT LIQD: 1.0000 | OROMUCOSAL | Status: DC | PRN

## 2019-12-29 MED ORDER — DEXAMETHASONE SODIUM PHOSPHATE 10 MG/ML IJ SOLN
10.0000 mg | Freq: Once | INTRAMUSCULAR | Status: AC
  Administered 2019-12-30: 10 mg via INTRAVENOUS
  Filled 2019-12-29: qty 1

## 2019-12-29 MED ORDER — DIPHENHYDRAMINE HCL 12.5 MG/5ML PO ELIX: 12.5000 mg | ORAL_SOLUTION | ORAL | Status: DC | PRN

## 2019-12-29 MED ORDER — MAGNESIUM CITRATE PO SOLN: 1.0000 | Freq: Once | ORAL | Status: DC | PRN

## 2019-12-29 MED ORDER — ONDANSETRON HCL 4 MG/2ML IJ SOLN: 4.0000 mg | Freq: Four times a day (QID) | INTRAMUSCULAR | Status: DC | PRN

## 2019-12-29 MED ORDER — MENTHOL 3 MG MT LOZG: 1.0000 | LOZENGE | OROMUCOSAL | Status: DC | PRN

## 2019-12-29 MED ORDER — CEFAZOLIN SODIUM-DEXTROSE 2-4 GM/100ML-% IV SOLN
2.0000 g | INTRAVENOUS | Status: AC
  Administered 2019-12-29: 2 g via INTRAVENOUS
  Filled 2019-12-29: qty 100

## 2019-12-29 MED ORDER — HYDROMORPHONE HCL 1 MG/ML IJ SOLN: 0.5000 mg | INTRAMUSCULAR | Status: DC | PRN

## 2019-12-29 MED ORDER — HYDROCHLOROTHIAZIDE 12.5 MG PO CAPS
12.5000 mg | ORAL_CAPSULE | Freq: Every day | ORAL | Status: DC
  Administered 2019-12-29 – 2019-12-30 (×2): 12.5 mg via ORAL
  Filled 2019-12-29 (×2): qty 1

## 2019-12-29 MED ORDER — CEFAZOLIN SODIUM-DEXTROSE 2-4 GM/100ML-% IV SOLN
2.0000 g | Freq: Four times a day (QID) | INTRAVENOUS | Status: AC
  Administered 2019-12-29 (×2): 2 g via INTRAVENOUS
  Filled 2019-12-29 (×2): qty 100

## 2019-12-29 MED ORDER — 0.9 % SODIUM CHLORIDE (POUR BTL) OPTIME
TOPICAL | Status: DC | PRN
  Administered 2019-12-29: 1000 mL

## 2019-12-29 MED ORDER — CELECOXIB 200 MG PO CAPS
200.0000 mg | ORAL_CAPSULE | Freq: Two times a day (BID) | ORAL | Status: DC
  Administered 2019-12-29 – 2019-12-30 (×3): 200 mg via ORAL
  Filled 2019-12-29 (×3): qty 1

## 2019-12-29 MED ORDER — FENTANYL CITRATE (PF) 100 MCG/2ML IJ SOLN: 25.0000 ug | INTRAMUSCULAR | Status: DC | PRN

## 2019-12-29 MED ORDER — ONDANSETRON HCL 4 MG/2ML IJ SOLN
INTRAMUSCULAR | Status: DC | PRN
  Administered 2019-12-29: 4 mg via INTRAVENOUS

## 2019-12-29 MED ORDER — METOCLOPRAMIDE HCL 5 MG/ML IJ SOLN: 5.0000 mg | Freq: Three times a day (TID) | INTRAMUSCULAR | Status: DC | PRN

## 2019-12-29 MED ORDER — ACETAMINOPHEN 500 MG PO TABS
1000.0000 mg | ORAL_TABLET | Freq: Once | ORAL | Status: AC
  Administered 2019-12-29: 1000 mg via ORAL
  Filled 2019-12-29: qty 2

## 2019-12-29 MED ORDER — DEXAMETHASONE SODIUM PHOSPHATE 10 MG/ML IJ SOLN
INTRAMUSCULAR | Status: AC
  Filled 2019-12-29: qty 1

## 2019-12-29 MED ORDER — CELECOXIB 200 MG PO CAPS
200.0000 mg | ORAL_CAPSULE | Freq: Once | ORAL | Status: AC
  Administered 2019-12-29: 200 mg via ORAL
  Filled 2019-12-29: qty 1

## 2019-12-29 MED ORDER — METOCLOPRAMIDE HCL 5 MG PO TABS: 5.0000 mg | ORAL_TABLET | Freq: Three times a day (TID) | ORAL | Status: DC | PRN

## 2019-12-29 MED ORDER — LACTATED RINGERS IV SOLN: INTRAVENOUS | Status: DC

## 2019-12-29 MED ORDER — LISINOPRIL 10 MG PO TABS
10.0000 mg | ORAL_TABLET | Freq: Every day | ORAL | Status: DC
  Administered 2019-12-29 – 2019-12-30 (×2): 10 mg via ORAL
  Filled 2019-12-29 (×2): qty 1

## 2019-12-29 MED ORDER — PHENYLEPHRINE 40 MCG/ML (10ML) SYRINGE FOR IV PUSH (FOR BLOOD PRESSURE SUPPORT)
PREFILLED_SYRINGE | INTRAVENOUS | Status: DC | PRN
  Administered 2019-12-29: 80 ug via INTRAVENOUS

## 2019-12-29 SURGICAL SUPPLY — 49 items
BAG DECANTER FOR FLEXI CONT (MISCELLANEOUS) IMPLANT
BAG ZIPLOCK 12X15 (MISCELLANEOUS) IMPLANT
BLADE SAG 18X100X1.27 (BLADE) ×3 IMPLANT
BLADE SURG SZ10 CARB STEEL (BLADE) ×6 IMPLANT
COVER PERINEAL POST (MISCELLANEOUS) ×3 IMPLANT
COVER SURGICAL LIGHT HANDLE (MISCELLANEOUS) ×3 IMPLANT
COVER WAND RF STERILE (DRAPES) ×2 IMPLANT
CUP ACET PINNACLE SECTR 50MM (Hips) IMPLANT
DERMABOND ADVANCED (GAUZE/BANDAGES/DRESSINGS) ×2
DERMABOND ADVANCED .7 DNX12 (GAUZE/BANDAGES/DRESSINGS) ×1 IMPLANT
DRAPE STERI IOBAN 125X83 (DRAPES) ×3 IMPLANT
DRAPE U-SHAPE 47X51 STRL (DRAPES) ×6 IMPLANT
DRESSING AQUACEL AG SP 3.5X10 (GAUZE/BANDAGES/DRESSINGS) ×1 IMPLANT
DRSG AQUACEL AG SP 3.5X10 (GAUZE/BANDAGES/DRESSINGS) ×3
DURAPREP 26ML APPLICATOR (WOUND CARE) ×3 IMPLANT
ELECT REM PT RETURN 15FT ADLT (MISCELLANEOUS) ×3 IMPLANT
ELIMINATOR HOLE APEX DEPUY (Hips) ×2 IMPLANT
FEM STEM 12/14 TAPER SZ 4 HIP (Orthopedic Implant) ×3 IMPLANT
FEMORAL STEM 12/14 TPR SZ4 HIP (Orthopedic Implant) IMPLANT
GLOVE BIO SURGEON STRL SZ 6 (GLOVE) ×6 IMPLANT
GLOVE BIOGEL PI IND STRL 6.5 (GLOVE) ×1 IMPLANT
GLOVE BIOGEL PI IND STRL 7.5 (GLOVE) ×1 IMPLANT
GLOVE BIOGEL PI IND STRL 8.5 (GLOVE) ×1 IMPLANT
GLOVE BIOGEL PI INDICATOR 6.5 (GLOVE) ×2
GLOVE BIOGEL PI INDICATOR 7.5 (GLOVE) ×2
GLOVE BIOGEL PI INDICATOR 8.5 (GLOVE) ×2
GLOVE ECLIPSE 8.0 STRL XLNG CF (GLOVE) ×6 IMPLANT
GLOVE ORTHO TXT STRL SZ7.5 (GLOVE) ×6 IMPLANT
GOWN STRL REUS W/TWL LRG LVL3 (GOWN DISPOSABLE) ×6 IMPLANT
GOWN STRL REUS W/TWL XL LVL3 (GOWN DISPOSABLE) ×3 IMPLANT
HEAD FEM STD 32X+1 STRL (Hips) ×2 IMPLANT
HOLDER FOLEY CATH W/STRAP (MISCELLANEOUS) ×3 IMPLANT
KIT TURNOVER KIT A (KITS) IMPLANT
LINER ACET PNNCL PLUS4 NEUTRAL (Hips) IMPLANT
PACK ANTERIOR HIP CUSTOM (KITS) ×3 IMPLANT
PENCIL SMOKE EVACUATOR (MISCELLANEOUS) ×2 IMPLANT
PINNACLE PLUS 4 NEUTRAL (Hips) ×3 IMPLANT
PINNACLE SECTOR CUP 50MM (Hips) ×3 IMPLANT
SCREW 6.5MMX25MM (Screw) ×2 IMPLANT
SUT MNCRL AB 4-0 PS2 18 (SUTURE) ×3 IMPLANT
SUT STRATAFIX 0 PDS 27 VIOLET (SUTURE) ×3
SUT VIC AB 1 CT1 36 (SUTURE) ×9 IMPLANT
SUT VIC AB 2-0 CT1 27 (SUTURE) ×6
SUT VIC AB 2-0 CT1 TAPERPNT 27 (SUTURE) ×2 IMPLANT
SUTURE STRATFX 0 PDS 27 VIOLET (SUTURE) ×1 IMPLANT
TRAY FOLEY MTR SLVR 14FR STAT (SET/KITS/TRAYS/PACK) ×2 IMPLANT
TRAY FOLEY MTR SLVR 16FR STAT (SET/KITS/TRAYS/PACK) IMPLANT
WATER STERILE IRR 1000ML POUR (IV SOLUTION) ×3 IMPLANT
YANKAUER SUCT BULB TIP 10FT TU (MISCELLANEOUS) IMPLANT

## 2019-12-29 NOTE — Op Note (Signed)
NAME:  Sharon Fitzgerald                ACCOUNT NO.: [id number]      MEDICAL RECORD NO.: [id number]      FACILITY:  [facility]      PHYSICIAN:  [name]  DATE OF BIRTH:  1940-09-23     DATE OF PROCEDURE:  12/29/2019                                 OPERATIVE REPORT         PREOPERATIVE DIAGNOSIS: Left  hip osteoarthritis.      POSTOPERATIVE DIAGNOSIS:  Left hip osteoarthritis.      PROCEDURE:  Left total hip replacement through an anterior approach   utilizing DePuy THR system, component size 50 mm pinnacle cup, a size 32+4 neutral   Altrex liner, a size 4 Hi Actis stem with a 32+1 Articuleze metal head ball.      SURGEON:  Pietro Cassis. Alvan Dame, M.D.      ASSISTANT:  Danae Orleans, PA-C     ANESTHESIA:  Spinal.      SPECIMENS:  None.      COMPLICATIONS:  None.      BLOOD LOSS:  250 cc     DRAINS:  None.      INDICATION OF THE PROCEDURE:  Sharon Fitzgerald is a 79 y.o. female who had   presented to office for evaluation of left hip pain.  Radiographs revealed   progressive degenerative changes with bone-on-bone   articulation of the  hip joint, including subchondral cystic changes and osteophytes.  The patient had painful limited range of   motion significantly affecting their overall quality of life and function.  The patient was failing to    respond to conservative measures including medications and/or injections and activity modification and at this point was ready   to proceed with more definitive measures.  Consent was obtained for   benefit of pain relief.  Specific risks of infection, DVT, component   failure, dislocation, neurovascular injury, and need for revision surgery were reviewed in the office as well discussion of   the anterior versus posterior approach were reviewed.     PROCEDURE IN DETAIL:  The patient was brought to operative theater.   Once adequate anesthesia, preoperative antibiotics, 2 gm of Ancef, 1 gm of Tranexamic Acid, and 10  mg of Decadron were administered, the patient was positioned supine on the [employer] table.  Once the patient was safely positioned with adequate padding of boney prominences we predraped out the hip, and used fluoroscopy to confirm orientation of the pelvis.      The left hip was then prepped and draped from proximal iliac crest to   mid thigh with a shower curtain technique.      Time-out was performed identifying the patient, planned procedure, and the appropriate extremity.     An incision was then made 2 cm lateral to the   anterior superior iliac spine extending over the orientation of the   tensor fascia lata muscle and sharp dissection was carried down to the   fascia of the muscle.      The fascia was then incised.  The muscle belly was identified and swept   laterally and retractor placed along the superior neck.  Following   cauterization of the circumflex vessels and removing some pericapsular  fat, a second cobra retractor was placed on the inferior neck.  A T-capsulotomy was made along the line of the   superior neck to the trochanteric fossa, then extended proximally and   distally.  Tag sutures were placed and the retractors were then placed   intracapsular.  We then identified the trochanteric fossa and   orientation of my neck cut and then made a neck osteotomy with the femur on traction.  The femoral   head was removed without difficulty or complication.  Traction was let   off and retractors were placed posterior and anterior around the   acetabulum.      The labrum and foveal tissue were debrided.  I began reaming with a 43 mm   reamer and reamed up to 49 mm reamer with good bony bed preparation and a 50 mm  cup was chosen.  The final 50 mm Pinnacle cup was then impacted under fluoroscopy to confirm the depth of penetration and orientation with respect to   Abduction and forward flexion.  A screw was placed into the ilium followed by the hole eliminator.  The final    32+4 neutral Altrex liner was impacted with good visualized rim fit.  The cup was positioned anatomically within the acetabular portion of the pelvis with intention of trying to add inclination and anteversion based on her lumbar-sacral fusion history. Posterior acetabular osteophytes were debrided.     At this point, the femur was rolled to 100 degrees.  Further capsule was   released off the inferior aspect of the femoral neck.  I then   released the superior capsule proximally.  With the leg in a neutral position the hook was placed laterally   along the femur under the vastus lateralis origin and elevated manually and then held in position using the hook attachment on the bed.  The leg was then extended and adducted with the leg rolled to 100   degrees of external rotation.  Retractors were placed along the medial calcar and posteriorly over the greater trochanter.  Once the proximal femur was fully   exposed, I used a box osteotome to set orientation.  I then began   broaching with the starting chili pepper broach and passed this by hand and then broached up to 4.  With the 4 broach in place I chose a high offset neck and did several trial reductions.  The offset was appropriate, leg lengths   appeared to be equal best matched with the +1 head ball trial confirmed radiographically.   Given these findings, I went ahead and dislocated the hip, repositioned all   retractors and positioned the right hip in the extended and abducted position.  The final 4 Hi Actis stem was   chosen and it was impacted down to the level of neck cut.  Based on this   and the trial reductions, a final 32+1 Articuleze metal ball was chosen and   impacted onto a clean and dry trunnion, and the hip was reduced.  The   hip had been irrigated throughout the case again at this point.  I did   reapproximate the superior capsular leaflet to the anterior leaflet   using #1 Vicryl.  The fascia of the   tensor fascia lata  muscle was then reapproximated using #1 Vicryl and #0 Stratafix sutures.  The   remaining wound was closed with 2-0 Vicryl and running 4-0 Monocryl.   The hip was cleaned, dried, and dressed sterilely  using Dermabond and   Aquacel dressing.  The patient was then brought   to recovery room in stable condition tolerating the procedure well.    Danae Orleans, PA-C was present for the entirety of the case involved from   preoperative positioning, perioperative retractor management, general   facilitation of the case, as well as primary wound closure as assistant.            Pietro Cassis Alvan Dame, M.D.        12/29/2019 9:53 AM

## 2019-12-29 NOTE — Progress Notes (Signed)
Orthopedic Tech Progress Note Patient Details:  Sharon Fitzgerald 01/14/1941 [id number]  Ortho Devices Ortho Device/Splint Location: Trapeze bar Ortho Device/Splint Interventions: Application   Post Interventions Patient Tolerated: Well Instructions Provided: Care of device, Adjustment of device   [place] Pink 12/29/2019, 12:54 PM

## 2019-12-29 NOTE — Plan of Care (Signed)

## 2019-12-29 NOTE — Discharge Instructions (Signed)

## 2019-12-29 NOTE — Evaluation (Signed)
Physical Therapy Evaluation Patient Details Name: Sharon Fitzgerald MRN: [id number] DOB: 1941-02-09 Today's Date: 12/29/2019   History of Present Illness  L AA-THA on 12/29/19. PMH of R THA 2020, R TKA 2014, back surgery.  Clinical Impression  Pt is s/p THA resulting in the deficits listed below (see PT Problem List). Pt ambulated 87' with RW, no loss of balance. INitiated THA HEP. Good progress expected.  Pt will benefit from skilled PT to increase their independence and safety with mobility to allow discharge to the venue listed below.      Follow Up Recommendations Follow surgeon's recommendation for DC plan and follow-up therapies    Equipment Recommendations  None recommended by PT    Recommendations for Other Services       Precautions / Restrictions Precautions Precautions: Fall Restrictions Weight Bearing Restrictions: No Other Position/Activity Restrictions: WBAT      Mobility  Bed Mobility Overal bed mobility: Modified Independent             General bed mobility comments: HOB up, used rail  Transfers Overall transfer level: Needs assistance Equipment used: Rolling walker (2 wheeled) Transfers: Sit to/from Stand Sit to Stand: Min guard         General transfer comment: VCs hand placement  Ambulation/Gait Ambulation/Gait assistance: Min guard Gait Distance (Feet): 50 Feet Assistive device: Rolling walker (2 wheeled) Gait Pattern/deviations: Step-to pattern;Decreased step length - right;Decreased step length - left;Decreased weight shift to left Gait velocity: decr   General Gait Details: VCs sequencing, no loss of balance  Stairs            Wheelchair Mobility    Modified Rankin (Stroke Patients Only)       Balance Overall balance assessment: Modified Independent                                           Pertinent Vitals/Pain Pain Assessment: 0-10 Pain Score: 4  Pain Location: L hip Pain Descriptors /  Indicators: Sore Pain Intervention(s): Limited activity within patient's tolerance;Monitored during session;Premedicated before session;Ice applied    Home Living Family/patient expects to be discharged to:: Private residence Living Arrangements: Children Available Help at Discharge: Family;Available 24 hours/day         Home Layout: Multi-level Home Equipment: Walker - 2 wheels      Prior Function Level of Independence: Independent with assistive device(s)         Comments: used cane PTA     Hand Dominance        Extremity/Trunk Assessment   Upper Extremity Assessment Upper Extremity Assessment: Overall WFL for tasks assessed    Lower Extremity Assessment Lower Extremity Assessment: LLE deficits/detail LLE Deficits / Details: L hip AAROM WFL LLE Sensation: WNL LLE Coordination: WNL    Cervical / Trunk Assessment Cervical / Trunk Assessment: Normal  Communication   Communication: No difficulties  Cognition Arousal/Alertness: Awake/alert Behavior During Therapy: WFL for tasks assessed/performed Overall Cognitive Status: Within Functional Limits for tasks assessed                                        General Comments      Exercises Total Joint Exercises Ankle Circles/Pumps: AROM;Both;10 reps;Supine Heel Slides: AAROM;10 reps;Left;Supine   Assessment/Plan    PT Assessment Patient needs continued  PT services  PT Problem List Decreased activity tolerance;Decreased mobility;Pain       PT Treatment Interventions DME instruction;Gait training;Functional mobility training;Stair training;Therapeutic exercise;Therapeutic activities;Patient/family education    PT Goals (Current goals can be found in the Care Plan section)  Acute Rehab PT Goals [facility] Goal Formulation: With patient Time For Goal Achievement: 01/05/20 Potential to Achieve Goals: Good    Frequency 7X/week   Barriers to discharge         Co-evaluation               AM-PAC PT "6 Clicks" Mobility  Outcome Measure Help needed turning from your back to your side while in a flat bed without using bedrails?: A Little Help needed moving from lying on your back to sitting on the side of a flat bed without using bedrails?: A Little Help needed moving to and from a bed to a chair (including a wheelchair)?: A Little Help needed standing up from a chair using your arms (e.g., wheelchair or bedside chair)?: A Little Help needed to walk in hospital room?: A Little Help needed climbing 3-5 steps with a railing? : A Lot 6 Click Score: 17    End of Session Equipment Utilized During Treatment: Gait belt Activity Tolerance: Patient tolerated treatment well Patient left: in chair;with call bell/phone within reach;with chair alarm set Nurse Communication: Mobility status PT Visit Diagnosis: Pain;Difficulty in walking, not elsewhere classified (R26.2) Pain - Right/Left: Left Pain - part of body: Hip    Time: [id number] PT Time Calculation (min) (ACUTE ONLY): 16 min   Charges:   PT Evaluation $PT Eval Low Complexity: 1 Low         Philomena Doheny PT 12/29/2019  Acute Rehabilitation Services Pager [phone] Office [phone]

## 2019-12-29 NOTE — Anesthesia Postprocedure Evaluation (Signed)
Anesthesia Post Note  Patient: Sharon Fitzgerald  Procedure(s) Performed: TOTAL HIP ARTHROPLASTY ANTERIOR APPROACH (Left Hip)     Patient location during evaluation: PACU Anesthesia Type: Spinal Level of consciousness: awake and alert Pain management: pain level controlled Vital Signs Assessment: post-procedure vital signs reviewed and stable Respiratory status: spontaneous breathing and respiratory function stable Cardiovascular status: blood pressure returned to baseline and stable Postop Assessment: spinal receding Anesthetic complications: no    Last Vitals:  Vitals:   12/29/19 1100 12/29/19 1115  BP: 130/77 (!) 152/77  Pulse: 66 64  Resp: 16 16  Temp:  36.6 C  SpO2: 98% 100%    Last Pain:  Vitals:   12/29/19 1115  TempSrc:   PainSc: 0-No pain                 Ladaija Dimino DANIEL

## 2019-12-29 NOTE — Transfer of Care (Signed)
Immediate Anesthesia Transfer of Care Note  Patient: Sharon Fitzgerald  Procedure(s) Performed: TOTAL HIP ARTHROPLASTY ANTERIOR APPROACH (Left Hip)  Patient Location: PACU  Anesthesia Type:Spinal  Level of Consciousness: drowsy  Airway & Oxygen Therapy: Patient Spontanous Breathing and Patient connected to face mask oxygen  Post-op Assessment: Report given to RN and Post -op Vital signs reviewed and stable  Post vital signs: Reviewed and stable  Last Vitals:  Vitals Value Taken Time  BP 112/69 12/29/19 1023  Temp    Pulse 69 12/29/19 1023  Resp    SpO2 99 % 12/29/19 1023  Vitals shown include unvalidated device data.  Last Pain:  Vitals:   12/29/19 0648  TempSrc: Oral      Patients Stated Pain Goal: 4 ([id number])  Complications: No apparent anesthesia complications

## 2019-12-29 NOTE — Anesthesia Procedure Notes (Signed)
Date/Time: 12/29/2019 8:24 AM Performed by: Sharlette Dense, CRNA Oxygen Delivery Method: Simple face mask

## 2019-12-29 NOTE — Interval H&P Note (Signed)
History and Physical Interval Note:  12/29/2019 7:11 AM  Sharon Fitzgerald  has presented today for surgery, with the diagnosis of Left hip osteoarthritis.  The various methods of treatment have been discussed with the patient and family. After consideration of risks, benefits and other options for treatment, the patient has consented to  Procedure(s) with comments: [name] (Left) - 71mins as a surgical intervention.  The patient's history has been reviewed, patient examined, no change in status, stable for surgery.  I have reviewed the patient's chart and labs.  Questions were answered to the patient's satisfaction.     [name]

## 2019-12-29 NOTE — Anesthesia Procedure Notes (Signed)
Spinal  Patient location during procedure: OR Start time: 12/29/2019 8:19 AM End time: 12/29/2019 8:29 AM Staffing Performed: anesthesiologist  Anesthesiologist: Duane Boston, MD Preanesthetic Checklist Completed: patient identified, IV checked, risks and benefits discussed, surgical consent, monitors and equipment checked, pre-op evaluation and timeout performed Spinal Block Patient position: sitting Prep: DuraPrep Patient monitoring: cardiac monitor, continuous pulse ox and blood pressure Approach: midline Location: L2-3 Injection technique: single-shot Needle Needle type: Pencan  Needle gauge: 24 G Needle length: 9 cm Additional Notes Functioning IV was confirmed and monitors were applied. Sterile prep and drape, including hand hygiene and sterile gloves were used. The patient was positioned and the spine was prepped. The skin was anesthetized with lidocaine.  Free flow of clear CSF was obtained prior to injecting local anesthetic into the CSF.  The spinal needle aspirated freely following injection.  The needle was carefully withdrawn.  The patient tolerated the procedure well.

## 2019-12-30 ENCOUNTER — Encounter: Payer: Self-pay | Admitting: *Deleted

## 2019-12-30 DIAGNOSIS — Z7982 Long term (current) use of aspirin: Secondary | ICD-10-CM | POA: Diagnosis not present

## 2019-12-30 DIAGNOSIS — E669 Obesity, unspecified: Secondary | ICD-10-CM | POA: Diagnosis not present

## 2019-12-30 DIAGNOSIS — Z96642 Presence of left artificial hip joint: Secondary | ICD-10-CM | POA: Diagnosis not present

## 2019-12-30 DIAGNOSIS — N189 Chronic kidney disease, unspecified: Secondary | ICD-10-CM | POA: Diagnosis not present

## 2019-12-30 DIAGNOSIS — E876 Hypokalemia: Secondary | ICD-10-CM

## 2019-12-30 DIAGNOSIS — I129 Hypertensive chronic kidney disease with stage 1 through stage 4 chronic kidney disease, or unspecified chronic kidney disease: Secondary | ICD-10-CM | POA: Diagnosis not present

## 2019-12-30 DIAGNOSIS — E785 Hyperlipidemia, unspecified: Secondary | ICD-10-CM | POA: Diagnosis not present

## 2019-12-30 DIAGNOSIS — Z79899 Other long term (current) drug therapy: Secondary | ICD-10-CM | POA: Diagnosis not present

## 2019-12-30 DIAGNOSIS — M1612 Unilateral primary osteoarthritis, left hip: Secondary | ICD-10-CM | POA: Diagnosis not present

## 2019-12-30 LAB — CBC
HCT: 30.5 % — ABNORMAL LOW (ref 36.0–46.0)
Hemoglobin: 9.5 g/dL — ABNORMAL LOW (ref 12.0–15.0)
MCH: 29.8 pg (ref 26.0–34.0)
MCHC: 31.1 g/dL (ref 30.0–36.0)
MCV: 95.6 fL (ref 80.0–100.0)
Platelets: 221 10*3/uL (ref 150–400)
RBC: 3.19 MIL/uL — ABNORMAL LOW (ref 3.87–5.11)
RDW: 11.8 % (ref 11.5–15.5)
WBC: 15.6 10*3/uL — ABNORMAL HIGH (ref 4.0–10.5)
nRBC: 0 % (ref 0.0–0.2)

## 2019-12-30 LAB — BASIC METABOLIC PANEL
Anion gap: 7 (ref 5–15)
BUN: 23 mg/dL (ref 8–23)
CO2: 26 mmol/L (ref 22–32)
Calcium: 7.9 mg/dL — ABNORMAL LOW (ref 8.9–10.3)
Chloride: 104 mmol/L (ref 98–111)
Creatinine, Ser: 1.24 mg/dL — ABNORMAL HIGH (ref 0.44–1.00)
GFR calc Af Amer: 49 mL/min — ABNORMAL LOW (ref >60)
GFR calc non Af Amer: 42 mL/min — ABNORMAL LOW (ref >60)
Glucose, Bld: 160 mg/dL — ABNORMAL HIGH (ref 70–99)
Potassium: 3.4 mmol/L — ABNORMAL LOW (ref 3.5–5.1)
Sodium: 137 mmol/L (ref 135–145)

## 2019-12-30 MED ORDER — HYDROCODONE-ACETAMINOPHEN 5-325 MG PO TABS: 1.0000 | ORAL_TABLET | ORAL | 0 refills | Status: DC | PRN

## 2019-12-30 MED ORDER — POLYETHYLENE GLYCOL 3350 17 G PO PACK
17.0000 g | PACK | Freq: Two times a day (BID) | ORAL | 0 refills | Status: DC
Start: 2019-12-30 — End: 2022-06-15

## 2019-12-30 MED ORDER — POTASSIUM CHLORIDE CRYS ER 20 MEQ PO TBCR
40.0000 meq | EXTENDED_RELEASE_TABLET | Freq: Once | ORAL | Status: AC
  Administered 2019-12-30: 40 meq via ORAL
  Filled 2019-12-30: qty 2

## 2019-12-30 MED ORDER — ASPIRIN 81 MG PO CHEW: 81.0000 mg | CHEWABLE_TABLET | Freq: Two times a day (BID) | ORAL | 0 refills | Status: AC

## 2019-12-30 MED ORDER — METHOCARBAMOL 500 MG PO TABS: 500.0000 mg | ORAL_TABLET | Freq: Four times a day (QID) | ORAL | 0 refills | Status: DC | PRN

## 2019-12-30 MED ORDER — FERROUS SULFATE 325 (65 FE) MG PO TABS: 325.0000 mg | ORAL_TABLET | Freq: Three times a day (TID) | ORAL | 0 refills | Status: DC

## 2019-12-30 MED ORDER — DOCUSATE SODIUM 100 MG PO CAPS
100.0000 mg | ORAL_CAPSULE | Freq: Two times a day (BID) | ORAL | 0 refills | Status: DC
Start: 2019-12-30 — End: 2022-06-15

## 2019-12-30 NOTE — Progress Notes (Signed)
     Subjective: 1 Day Post-Op Procedure(s) (LRB): TOTAL HIP ARTHROPLASTY ANTERIOR APPROACH (Left)   Patient reports pain as mild, pain controlled.  No reported onset of the night.  Dr. Alvan Dame discussed the procedure, findings and expectations moving forward.  Patient will be ready be discharged home, she does well with therapy.  Patient is to call with any questions or concerns.  Patient follow-up in the clinic in 2 weeks.     Objective:   VITALS:   Vitals:   12/30/19 0141 12/30/19 0519  BP: 133/71 122/65  Pulse: 70 70  Resp: 16 15  Temp: 97.9 F (36.6 C) 98.4 F (36.9 C)  SpO2: 98% 98%    Dorsiflexion/Plantar flexion intact Incision: dressing C/D/I No cellulitis present Compartment soft  LABS Recent Labs    12/30/19 0310  HGB 9.5*  HCT 30.5*  WBC 15.6*  PLT 221    Recent Labs    12/30/19 0310  NA 137  K 3.4*  BUN 23  CREATININE 1.24*  GLUCOSE 160*     Assessment/Plan: 1 Day Post-Op Procedure(s) (LRB): TOTAL HIP ARTHROPLASTY ANTERIOR APPROACH (Left) Foley cath d/c'ed Advance diet Up with therapy D/C IV fluids Discharge home Follow up in 2 weeks at [facility] Follow up with OLIN,Edessa Jakubowicz D in 2 weeks.  Contact information:  [facility] [address], [address] [phone]    Obese (BMI 30-39.9) Estimated body mass index is 33.55 kg/m as calculated from the following:   Height as of this encounter: 5\' 3"  (1.6 m).   Weight as of this encounter: 85.9 kg. Patient also counseled that weight may inhibit the healing process Patient counseled that losing weight will help with future health issues   Hypokalemia Treated with oral potassium and will observe            Danae Orleans PA-C  [facility]  [place] [address], [place], [place] [address] Phone: [phone] [email or web address] Facebook  [employer]

## 2019-12-30 NOTE — Progress Notes (Signed)
Physical Therapy Treatment Patient Details Name: Sharon Fitzgerald MRN: [id number] DOB: 03/21/1941 Today's Date: 12/30/2019    History of Present Illness L AA-THA on 12/29/19. PMH of R THA 2020, R TKA 2014, back surgery.    PT Comments    Pt ambulated 130' with RW, completed stair training, and demonstrates good understanding of HEP. She is ready to DC home from PT standpoint.  Follow Up Recommendations  Follow surgeon's recommendation for DC plan and follow-up therapies     Equipment Recommendations  None recommended by PT    Recommendations for Other Services       Precautions / Restrictions Precautions Precautions: Fall Restrictions Weight Bearing Restrictions: No Other Position/Activity Restrictions: WBAT    Mobility  Bed Mobility               General bed mobility comments: up in recliner  Transfers Overall transfer level: Modified independent Equipment used: Rolling walker (2 wheeled) Transfers: Sit to/from Stand Sit to Stand: Modified independent (Device/Increase time)         General transfer comment: used armrest, good hand placement  Ambulation/Gait Ambulation/Gait assistance: Modified independent (Device/Increase time) Gait Distance (Feet): 130 Feet Assistive device: Rolling walker (2 wheeled) Gait Pattern/deviations: Step-through pattern;Decreased stride length Gait velocity: decr   General Gait Details: steady, no loss of balance   Stairs Stairs: Yes Stairs assistance: Supervision Stair Management: Two rails;Step to pattern;Forwards Number of Stairs: 3 General stair comments: VCs sequencing, no loss of balance   Wheelchair Mobility    Modified Rankin (Stroke Patients Only)       Balance Overall balance assessment: Modified Independent                                          Cognition Arousal/Alertness: Awake/alert Behavior During Therapy: WFL for tasks assessed/performed Overall Cognitive Status: Within  Functional Limits for tasks assessed                                        Exercises Total Joint Exercises Ankle Circles/Pumps: AROM;Both;10 reps;Supine Quad Sets: AROM;Left;5 reps;Supine Short Arc Quad: AROM;Left;5 reps;Supine Heel Slides: AAROM;10 reps;Left;Supine Hip ABduction/ADduction: AAROM;Left;10 reps;Supine Long Arc Quad: AROM;Left;5 reps;Seated    General Comments        Pertinent Vitals/Pain Pain Score: 5  Pain Location: L hip Pain Descriptors / Indicators: Sore Pain Intervention(s): Limited activity within patient's tolerance;Monitored during session;Premedicated before session;Ice applied    Home Living                      Prior Function            PT Goals (current goals can now be found in the care plan section) Acute Rehab PT Goals [facility] St[facility] Goal Formulation: With patient/family Time For Goal Achievement: 01/05/20 Potential to Achieve Goals: Good Progress towards PT goals: Goals met/education completed, patient discharged from PT    Frequency    7X/week      PT Plan Current plan remains appropriate    Co-evaluation              AM-PAC PT "6 Clicks" Mobility   Outcome Measure  Help needed turning from your back to your side while in a flat bed without using bedrails?: None Help  needed moving from lying on your back to sitting on the side of a flat bed without using bedrails?: None Help needed moving to and from a bed to a chair (including a wheelchair)?: None Help needed standing up from a chair using your arms (e.g., wheelchair or bedside chair)?: None Help needed to walk in hospital room?: None Help needed climbing 3-5 steps with a railing? : None 6 Click Score: 24    End of Session Equipment Utilized During Treatment: Gait belt Activity Tolerance: Patient tolerated treatment well Patient left: in chair;with call bell/phone within reach;with family/visitor present Nurse  Communication: Mobility status PT Visit Diagnosis: Pain;Difficulty in walking, not elsewhere classified (R26.2) Pain - Right/Left: Left Pain - part of body: Hip     Time: 4128-7867 PT Time Calculation (min) (ACUTE ONLY): 19 min  Charges:  $Gait Training: 8-22 mins                     Blondell Reveal Kistler PT 12/30/2019  Acute Rehabilitation Services Pager [phone] Office [phone]

## 2019-12-30 NOTE — Discharge Summary (Signed)
Patient ID: Sharon Fitzgerald MRN: [id number] DOB/AGE: 04-24-1941 79 y.o.  Admit date: 12/29/2019 Discharge date: 12/30/2019  Admission Diagnoses:  Principal Problem:   Left hip OA  /pain Active Problems:   Obese   S/P hip replacement, left   Hypokalemia   Discharge Diagnoses:  Same  Past Medical History:  Diagnosis Date  . Anemia   . Arthritis   . Carpal tunnel syndrome on right   . CKD (chronic kidney disease)   . Hyperlipidemia   . Hypertension   . Obese   . Spinal stenosis    sees Dr. Rolena Infante    Surgeries: Procedure(s): LEFT TOTAL HIP ARTHROPLASTY ANTERIOR APPROACH on 12/29/2019   Consultants:   Discharged Condition: Improved  Hospital Course: Sharon Fitzgerald is an 79 y.o. female who was admitted 12/29/2019 for operative treatment of left hip OA  /pain. Patient has severe unremitting pain that affects sleep, daily activities, and work/hobbies. After pre-op clearance the patient was taken to the operating room on 12/29/2019 and underwent  Procedure(s): LEFT TOTAL HIP ARTHROPLASTY ANTERIOR APPROACH.    Patient was given perioperative antibiotics:  Anti-infectives (From admission, onward)   Start     Dose/Rate Route Frequency Ordered Stop   12/29/19 1430  ceFAZolin (ANCEF) IVPB 2g/100 mL premix     2 g 200 mL/hr over 30 Minutes Intravenous Every 6 hours 12/29/19 1148 12/29/19 2105   12/29/19 0630  ceFAZolin (ANCEF) IVPB 2g/100 mL premix     2 g 200 mL/hr over 30 Minutes Intravenous On call to O.R. 12/29/19 [id number] 12/29/19 [id number]       Patient was given sequential compression devices, early ambulation, and chemoprophylaxis to prevent DVT.  Patient benefited maximally from hospital stay and there were no complications.    Recent vital signs:  Patient Vitals for the past 24 hrs:  BP Temp Temp src Pulse Resp SpO2  12/30/19 0519 122/65 98.4 F (36.9 C) -- 70 15 98 %  12/30/19 0141 133/71 97.9 F (36.6 C) -- 70 16 98 %  12/29/19 2159 (!) 147/86 97.8 F (36.6 C) -- 70  16 98 %  12/29/19 1828 (!) 145/87 98 F (36.7 C) Oral 75 13 98 %  12/29/19 1448 (!) 171/83 (!) 97.5 F (36.4 C) Oral 75 14 100 %  12/29/19 1345 (!) 187/86 (!) 97.5 F (36.4 C) Oral 75 12 100 %  12/29/19 1252 (!) 177/74 97.9 F (36.6 C) Oral 69 14 100 %  12/29/19 1144 (!) 163/88 (!) 97.5 F (36.4 C) Oral 65 15 100 %  12/29/19 1115 (!) 152/77 97.9 F (36.6 C) -- 64 16 100 %  12/29/19 1100 130/77 -- -- 66 16 98 %  12/29/19 1045 (!) 142/76 -- -- 63 16 98 %  12/29/19 1030 129/70 -- -- 65 16 100 %  12/29/19 1022 112/69 97.6 F (36.4 C) -- 70 17 100 %     Recent laboratory studies:  Recent Labs    12/30/19 0310  WBC 15.6*  HGB 9.5*  HCT 30.5*  PLT 221  NA 137  K 3.4*  CL 104  CO2 26  BUN 23  CREATININE 1.24*  GLUCOSE 160*  CALCIUM 7.9*     Discharge Medications:   Allergies as of 12/30/2019   No Known Allergies     Medication List    STOP taking these medications   aspirin EC 325 MG tablet Replaced by: aspirin 81 MG chewable tablet   traMADol 50 MG tablet Commonly known as: Veatrice Bourbon  TAKE these medications   aspirin 81 MG chewable tablet Commonly known as: Aspirin Childrens Chew 1 tablet (81 mg total) by mouth 2 (two) times daily. Take for 4 weeks, then resume regular dose. Replaces: aspirin EC 325 MG tablet   cholecalciferol 25 MCG (1000 UNIT) tablet Commonly known as: VITAMIN D Take 1,000 Units by mouth daily.   docusate sodium 100 MG capsule Commonly known as: Colace Take 1 capsule (100 mg total) by mouth 2 (two) times daily.   ferrous sulfate 325 (65 FE) MG tablet Commonly known as: FerrouSul Take 1 tablet (325 mg total) by mouth 3 (three) times daily with meals for 14 days. What changed:   how much to take  when to take this   HYDROcodone-acetaminophen 5-325 MG tablet Commonly known as: Norco Take 1-2 tablets by mouth every 4 (four) hours as needed for moderate pain or severe pain.   lisinopril-hydrochlorothiazide 10-12.5 MG  tablet Commonly known as: ZESTORETIC Take 1 tablet by mouth daily.   methocarbamol 500 MG tablet Commonly known as: Robaxin Take 1 tablet (500 mg total) by mouth every 6 (six) hours as needed for muscle spasms.   polyethylene glycol 17 g packet Commonly known as: MIRALAX / GLYCOLAX Take 17 g by mouth 2 (two) times daily.   pravastatin 20 MG tablet Commonly known as: PRAVACHOL Take 20 mg by mouth daily.   Vitamin B-12 3000 MCG Subl Take 6,000 mcg by mouth daily.            Discharge Care Instructions  (From admission, onward)         Start     Ordered   12/30/19 0000  Change dressing    Comments: Maintain surgical dressing until follow up in the clinic. If the edges start to pull up, may reinforce with tape. If the dressing is no longer working, may remove and cover with gauze and tape, but must keep the area dry and clean.  Call with any questions or concerns.   12/30/19 0828          Diagnostic Studies: DG Pelvis Portable  Result Date: 12/29/2019 CLINICAL DATA:  Postop from a left hip replacement. EXAM: PORTABLE PELVIS 1-2 VIEWS COMPARISON:  Operative images from earlier today. FINDINGS: Acetabular and femoral components of the total left hip arthroplasty appear well seated and well aligned. No acute fracture or evidence of an operative complication. IMPRESSION: Well-positioned total left hip arthroplasty. Electronically Signed   By: Lajean Manes M.D.   On: 12/29/2019 11:45   DG C-Arm 1-60 Min-No Report  Result Date: 12/29/2019 Fluoroscopy was utilized by the requesting physician.  No radiographic interpretation.   DG HIP OPERATIVE UNILAT W OR W/O PELVIS LEFT  Result Date: 12/29/2019 CLINICAL DATA:  Left hip replacement. EXAM: OPERATIVE LEFT HIP (WITH PELVIS IF PERFORMED)  VIEWS TECHNIQUE: Fluoroscopic spot image(s) were submitted for interpretation post-operatively. COMPARISON:  01/13/2019. FINDINGS: Total left hip replacement. Hardware intact. Anatomic alignment.  Prior total right hip replacement. IMPRESSION: Total left hip replacement with anatomic alignment. Electronically Signed   By: Marcello Moores  Register   On: 12/29/2019 10:24    Disposition: Discharge disposition: 01-Home or Self Care       Discharge Instructions    Call MD / Call 911   Complete by: As directed    If you experience chest pain or shortness of breath, CALL 911 and be transported to the hospital emergency room.  If you develope a fever above 101 F, pus (white drainage) or increased drainage  or redness at the wound, or calf pain, call your surgeon's office.   Change dressing   Complete by: As directed    Maintain surgical dressing until follow up in the clinic. If the edges start to pull up, may reinforce with tape. If the dressing is no longer working, may remove and cover with gauze and tape, but must keep the area dry and clean.  Call with any questions or concerns.   Constipation Prevention   Complete by: As directed    Drink plenty of fluids.  Prune juice may be helpful.  You may use a stool softener, such as Colace (over the counter) 100 mg twice a day.  Use MiraLax (over the counter) for constipation as needed.   Diet - low sodium heart healthy   Complete by: As directed    Discharge instructions   Complete by: As directed    Maintain surgical dressing until follow up in the clinic. If the edges start to pull up, may reinforce with tape. If the dressing is no longer working, may remove and cover with gauze and tape, but must keep the area dry and clean.  Follow up in 2 weeks at [facility]. Call with any questions or concerns.   Increase activity slowly as tolerated   Complete by: As directed    Weight bearing as tolerated with assist device (walker, cane, etc) as directed, use it as long as suggested by your surgeon or therapist, typically at least 4-6 weeks.   TED hose   Complete by: As directed    Use stockings (TED hose) for 2 weeks on both leg(s).  You may remove them  at night for sleeping.      Follow-up Information    Paralee Cancel, MD. Schedule an appointment as soon as possible for a visit in 2 weeks.   Specialty: Orthopedic Surgery Contact information: [address] [place] [address] [phone]            Signed: Lucille Passy [facility] 12/30/2019, 8:32 AM

## 2019-12-30 NOTE — TOC Transition Note (Signed)
Transition of Care [facility]) - CM/SW Discharge Note   Patient Details  Name: Sharon Fitzgerald MRN: [id number] Date of Birth: September 21, 1940  [facility] of [facility] CM/SW Contact:  Lia Hopping, LCSW Phone Number: 12/30/2019, 10:04 AM   Clinical Narrative:    Therapy Plan: HEP Confirm DME   Final next level of care: (HEP) Barriers to Discharge: No Barriers Identified   Patient Goals and CMS Choice     Choice offered to / list presented to : NA  Discharge Placement                       Discharge Plan and Services                DME Arranged: N/A DME Agency: NA       HH Arranged: NA HH Agency: NA        Social Determinants of Health (SDOH) Interventions     Readmission Risk Interventions No flowsheet data found.

## 2019-12-30 NOTE — Plan of Care (Signed)
  Problem: Education: Goal: Knowledge of General Education information will improve Description: Including pain rating scale, medication(s)/side effects and non-pharmacologic comfort measures Outcome: Progressing   Problem: Pain Managment: Goal: General experience of comfort will improve Outcome: Progressing   Problem: Education: Goal: Knowledge of the prescribed therapeutic regimen will improve Outcome: Progressing Goal: Understanding of discharge needs will improve Outcome: Progressing Goal: Individualized Educational Video(s) Outcome: Progressing

## 2019-12-30 NOTE — Progress Notes (Signed)
Patient discharged to home w/ family. Given all belongings, instructions. Verbalized understanding of all instructions. Escorted to pov via w/c. 

## 2020-01-26 DIAGNOSIS — N3941 Urge incontinence: Secondary | ICD-10-CM | POA: Diagnosis not present

## 2020-01-26 DIAGNOSIS — R350 Frequency of micturition: Secondary | ICD-10-CM | POA: Diagnosis not present

## 2020-02-15 DIAGNOSIS — Z471 Aftercare following joint replacement surgery: Secondary | ICD-10-CM | POA: Diagnosis not present

## 2020-02-15 DIAGNOSIS — Z96642 Presence of left artificial hip joint: Secondary | ICD-10-CM | POA: Diagnosis not present

## 2020-03-01 DIAGNOSIS — N3941 Urge incontinence: Secondary | ICD-10-CM | POA: Diagnosis not present

## 2020-03-01 DIAGNOSIS — R350 Frequency of micturition: Secondary | ICD-10-CM | POA: Diagnosis not present

## 2020-03-24 DIAGNOSIS — Z1231 Encounter for screening mammogram for malignant neoplasm of breast: Secondary | ICD-10-CM | POA: Diagnosis not present

## 2020-03-28 DIAGNOSIS — Z471 Aftercare following joint replacement surgery: Secondary | ICD-10-CM | POA: Diagnosis not present

## 2020-03-28 DIAGNOSIS — Z96642 Presence of left artificial hip joint: Secondary | ICD-10-CM | POA: Diagnosis not present

## 2020-03-29 DIAGNOSIS — N3941 Urge incontinence: Secondary | ICD-10-CM | POA: Diagnosis not present

## 2020-04-05 DIAGNOSIS — R928 Other abnormal and inconclusive findings on diagnostic imaging of breast: Secondary | ICD-10-CM | POA: Diagnosis not present

## 2020-04-05 DIAGNOSIS — N6002 Solitary cyst of left breast: Secondary | ICD-10-CM | POA: Diagnosis not present

## 2020-04-05 DIAGNOSIS — N6001 Solitary cyst of right breast: Secondary | ICD-10-CM | POA: Diagnosis not present

## 2020-05-05 DIAGNOSIS — N3941 Urge incontinence: Secondary | ICD-10-CM | POA: Diagnosis not present

## 2020-05-05 DIAGNOSIS — R350 Frequency of micturition: Secondary | ICD-10-CM | POA: Diagnosis not present

## 2020-06-09 DIAGNOSIS — R350 Frequency of micturition: Secondary | ICD-10-CM | POA: Diagnosis not present

## 2020-06-09 DIAGNOSIS — N3941 Urge incontinence: Secondary | ICD-10-CM | POA: Diagnosis not present

## 2020-07-07 DIAGNOSIS — N3941 Urge incontinence: Secondary | ICD-10-CM | POA: Diagnosis not present

## 2020-08-11 DIAGNOSIS — R350 Frequency of micturition: Secondary | ICD-10-CM | POA: Diagnosis not present

## 2020-09-08 DIAGNOSIS — R350 Frequency of micturition: Secondary | ICD-10-CM | POA: Diagnosis not present

## 2020-09-08 DIAGNOSIS — N3941 Urge incontinence: Secondary | ICD-10-CM | POA: Diagnosis not present

## 2020-10-06 DIAGNOSIS — R350 Frequency of micturition: Secondary | ICD-10-CM | POA: Diagnosis not present

## 2020-10-06 DIAGNOSIS — N3941 Urge incontinence: Secondary | ICD-10-CM | POA: Diagnosis not present

## 2020-10-17 DIAGNOSIS — Z0000 Encounter for general adult medical examination without abnormal findings: Secondary | ICD-10-CM | POA: Diagnosis not present

## 2020-10-17 DIAGNOSIS — M859 Disorder of bone density and structure, unspecified: Secondary | ICD-10-CM | POA: Diagnosis not present

## 2020-10-17 DIAGNOSIS — Z23 Encounter for immunization: Secondary | ICD-10-CM | POA: Diagnosis not present

## 2020-10-17 DIAGNOSIS — M4800 Spinal stenosis, site unspecified: Secondary | ICD-10-CM | POA: Diagnosis not present

## 2020-10-17 DIAGNOSIS — N183 Chronic kidney disease, stage 3 unspecified: Secondary | ICD-10-CM | POA: Diagnosis not present

## 2020-10-17 DIAGNOSIS — E782 Mixed hyperlipidemia: Secondary | ICD-10-CM | POA: Diagnosis not present

## 2020-10-17 DIAGNOSIS — I10 Essential (primary) hypertension: Secondary | ICD-10-CM | POA: Diagnosis not present

## 2020-11-03 DIAGNOSIS — I10 Essential (primary) hypertension: Secondary | ICD-10-CM | POA: Diagnosis not present

## 2020-11-03 DIAGNOSIS — N3941 Urge incontinence: Secondary | ICD-10-CM | POA: Diagnosis not present

## 2020-11-11 DIAGNOSIS — M25512 Pain in left shoulder: Secondary | ICD-10-CM | POA: Diagnosis not present

## 2020-12-06 DIAGNOSIS — Z20822 Contact with and (suspected) exposure to covid-19: Secondary | ICD-10-CM | POA: Diagnosis not present

## 2020-12-15 DIAGNOSIS — N3941 Urge incontinence: Secondary | ICD-10-CM | POA: Diagnosis not present

## 2020-12-15 DIAGNOSIS — R350 Frequency of micturition: Secondary | ICD-10-CM | POA: Diagnosis not present

## 2020-12-19 DIAGNOSIS — D631 Anemia in chronic kidney disease: Secondary | ICD-10-CM | POA: Diagnosis not present

## 2020-12-19 DIAGNOSIS — N2581 Secondary hyperparathyroidism of renal origin: Secondary | ICD-10-CM | POA: Diagnosis not present

## 2020-12-19 DIAGNOSIS — N1832 Chronic kidney disease, stage 3b: Secondary | ICD-10-CM | POA: Diagnosis not present

## 2020-12-19 DIAGNOSIS — I129 Hypertensive chronic kidney disease with stage 1 through stage 4 chronic kidney disease, or unspecified chronic kidney disease: Secondary | ICD-10-CM | POA: Diagnosis not present

## 2020-12-19 DIAGNOSIS — N189 Chronic kidney disease, unspecified: Secondary | ICD-10-CM | POA: Diagnosis not present

## 2020-12-19 DIAGNOSIS — N3281 Overactive bladder: Secondary | ICD-10-CM | POA: Diagnosis not present

## 2020-12-19 DIAGNOSIS — E785 Hyperlipidemia, unspecified: Secondary | ICD-10-CM | POA: Diagnosis not present

## 2020-12-21 ENCOUNTER — Other Ambulatory Visit: Payer: Self-pay | Admitting: Nephrology

## 2020-12-21 DIAGNOSIS — N1832 Chronic kidney disease, stage 3b: Secondary | ICD-10-CM

## 2021-01-12 DIAGNOSIS — N3941 Urge incontinence: Secondary | ICD-10-CM | POA: Diagnosis not present

## 2021-01-12 DIAGNOSIS — R350 Frequency of micturition: Secondary | ICD-10-CM | POA: Diagnosis not present

## 2021-01-27 IMAGING — RF OPERATIVE RIGHT HIP WITH PELVIS
1 series · 2 of 2 positions shown · non-contrast
Comparison: None.

CLINICAL DATA: Intraoperative imaging for right hip replacement.

EXAM:
OPERATIVE RIGHT HIP (WITH PELVIS IF PERFORMED) 2 VIEWS
TECHNIQUE: Fluoroscopic spot image(s) were submitted for interpretation
post-operatively.

[Series 1: unknown protocol · 0.20mm/px · 2 of 2 slices shown]
[im 1/2]
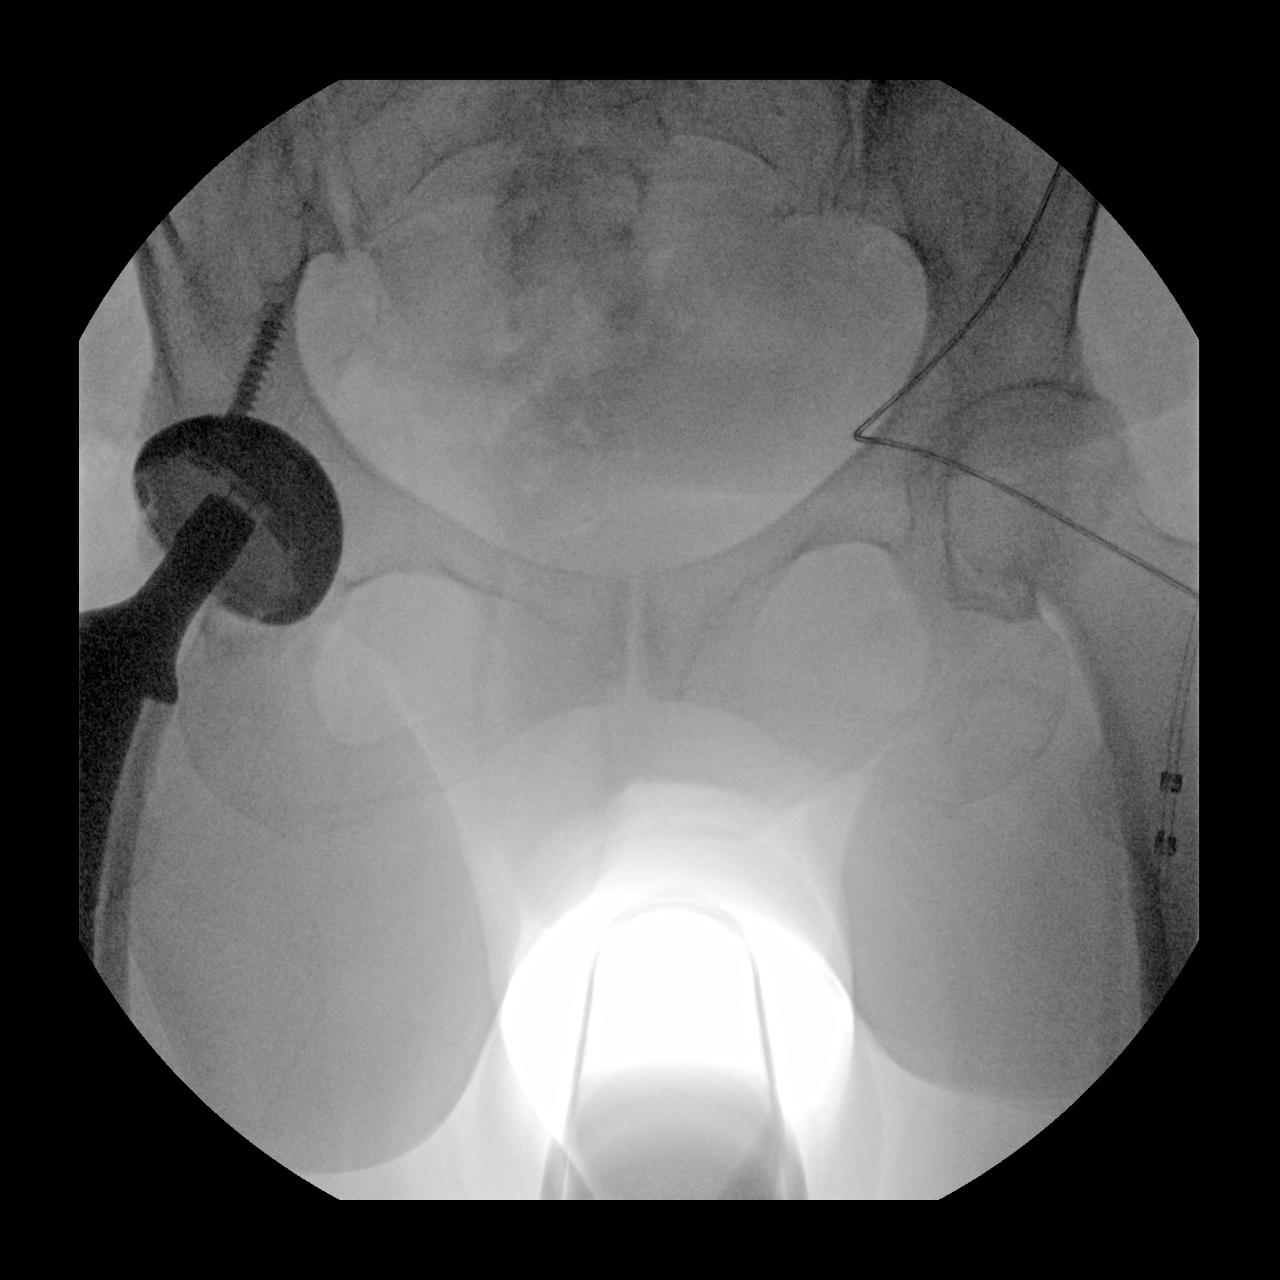
[im 2/2]
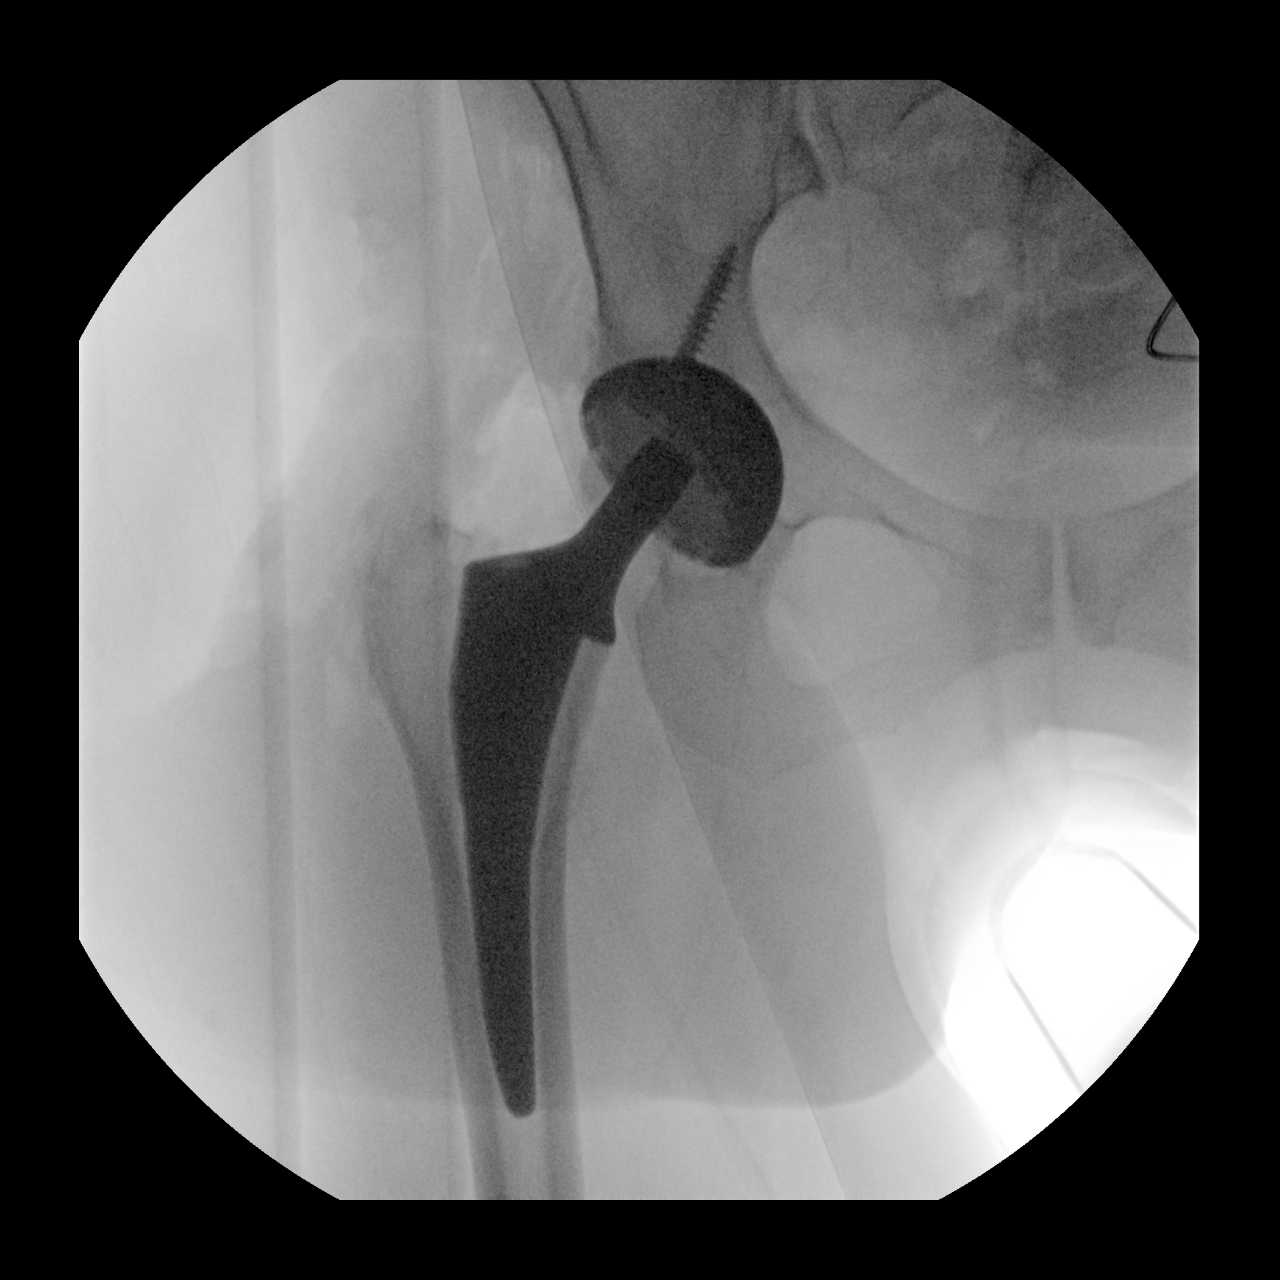

[2 of 2 positions shown; findings below may reference images not displayed]

FINDINGS: Single fluoroscopic spot views of the low pelvis and right hip are
provided. Images demonstrate acetabular cup and femoral stem in
place for right hip replacement. Surgical wound noted. No acute
finding.
IMPRESSION: Right hip replacement in progress.  No acute finding.

## 2021-04-03 DIAGNOSIS — M7542 Impingement syndrome of left shoulder: Secondary | ICD-10-CM | POA: Diagnosis not present

## 2021-05-18 DIAGNOSIS — R350 Frequency of micturition: Secondary | ICD-10-CM | POA: Diagnosis not present

## 2021-05-18 DIAGNOSIS — N3941 Urge incontinence: Secondary | ICD-10-CM | POA: Diagnosis not present

## 2021-06-01 DIAGNOSIS — Z1231 Encounter for screening mammogram for malignant neoplasm of breast: Secondary | ICD-10-CM | POA: Diagnosis not present

## 2021-06-22 DIAGNOSIS — N3941 Urge incontinence: Secondary | ICD-10-CM | POA: Diagnosis not present

## 2021-07-06 DIAGNOSIS — M7542 Impingement syndrome of left shoulder: Secondary | ICD-10-CM | POA: Diagnosis not present

## 2021-07-06 DIAGNOSIS — M25512 Pain in left shoulder: Secondary | ICD-10-CM | POA: Diagnosis not present

## 2021-07-27 DIAGNOSIS — N3941 Urge incontinence: Secondary | ICD-10-CM | POA: Diagnosis not present

## 2021-07-27 DIAGNOSIS — R350 Frequency of micturition: Secondary | ICD-10-CM | POA: Diagnosis not present

## 2021-08-03 ENCOUNTER — Ambulatory Visit
Admission: RE | Admit: 2021-08-03 | Discharge: 2021-08-03 | Disposition: A | Discharge status: Home / Self Care | Payer: Medicare PPO | Source: Ambulatory Visit | Attending: Nephrology | Admitting: Nephrology

## 2021-08-03 DIAGNOSIS — N1832 Chronic kidney disease, stage 3b: Secondary | ICD-10-CM

## 2021-08-03 DIAGNOSIS — N189 Chronic kidney disease, unspecified: Secondary | ICD-10-CM | POA: Diagnosis not present

## 2021-08-31 DIAGNOSIS — N3941 Urge incontinence: Secondary | ICD-10-CM | POA: Diagnosis not present

## 2021-09-19 DIAGNOSIS — M179 Osteoarthritis of knee, unspecified: Secondary | ICD-10-CM | POA: Diagnosis not present

## 2021-09-19 DIAGNOSIS — E782 Mixed hyperlipidemia: Secondary | ICD-10-CM | POA: Diagnosis not present

## 2021-09-19 DIAGNOSIS — I10 Essential (primary) hypertension: Secondary | ICD-10-CM | POA: Diagnosis not present

## 2021-09-19 DIAGNOSIS — G894 Chronic pain syndrome: Secondary | ICD-10-CM | POA: Diagnosis not present

## 2021-09-19 DIAGNOSIS — N183 Chronic kidney disease, stage 3 unspecified: Secondary | ICD-10-CM | POA: Diagnosis not present

## 2021-09-19 DIAGNOSIS — M4800 Spinal stenosis, site unspecified: Secondary | ICD-10-CM | POA: Diagnosis not present

## 2021-09-26 ENCOUNTER — Other Ambulatory Visit: Payer: Self-pay | Admitting: Family Medicine

## 2021-09-26 DIAGNOSIS — E2839 Other primary ovarian failure: Secondary | ICD-10-CM

## 2021-10-05 DIAGNOSIS — N3941 Urge incontinence: Secondary | ICD-10-CM | POA: Diagnosis not present

## 2021-10-11 DIAGNOSIS — Z1159 Encounter for screening for other viral diseases: Secondary | ICD-10-CM | POA: Diagnosis not present

## 2021-10-11 DIAGNOSIS — M25562 Pain in left knee: Secondary | ICD-10-CM | POA: Diagnosis not present

## 2021-10-11 DIAGNOSIS — M546 Pain in thoracic spine: Secondary | ICD-10-CM | POA: Diagnosis not present

## 2021-10-11 DIAGNOSIS — M545 Low back pain, unspecified: Secondary | ICD-10-CM | POA: Diagnosis not present

## 2021-10-11 DIAGNOSIS — E559 Vitamin D deficiency, unspecified: Secondary | ICD-10-CM | POA: Diagnosis not present

## 2021-10-11 DIAGNOSIS — G8929 Other chronic pain: Secondary | ICD-10-CM | POA: Diagnosis not present

## 2021-10-11 DIAGNOSIS — Z79899 Other long term (current) drug therapy: Secondary | ICD-10-CM | POA: Diagnosis not present

## 2021-10-11 DIAGNOSIS — M129 Arthropathy, unspecified: Secondary | ICD-10-CM | POA: Diagnosis not present

## 2021-10-11 DIAGNOSIS — Z0000 Encounter for general adult medical examination without abnormal findings: Secondary | ICD-10-CM | POA: Diagnosis not present

## 2021-10-26 DIAGNOSIS — Z79899 Other long term (current) drug therapy: Secondary | ICD-10-CM | POA: Diagnosis not present

## 2021-10-26 DIAGNOSIS — M546 Pain in thoracic spine: Secondary | ICD-10-CM | POA: Diagnosis not present

## 2021-10-26 DIAGNOSIS — Z0130 Encounter for examination of blood pressure without abnormal findings: Secondary | ICD-10-CM | POA: Diagnosis not present

## 2021-10-26 DIAGNOSIS — M545 Low back pain, unspecified: Secondary | ICD-10-CM | POA: Diagnosis not present

## 2021-10-26 DIAGNOSIS — M48061 Spinal stenosis, lumbar region without neurogenic claudication: Secondary | ICD-10-CM | POA: Diagnosis not present

## 2021-10-26 DIAGNOSIS — N183 Chronic kidney disease, stage 3 unspecified: Secondary | ICD-10-CM | POA: Diagnosis not present

## 2021-11-02 DIAGNOSIS — M25512 Pain in left shoulder: Secondary | ICD-10-CM | POA: Diagnosis not present

## 2021-11-09 DIAGNOSIS — N3941 Urge incontinence: Secondary | ICD-10-CM | POA: Diagnosis not present

## 2021-11-14 DIAGNOSIS — M5451 Vertebrogenic low back pain: Secondary | ICD-10-CM | POA: Diagnosis not present

## 2021-11-16 DIAGNOSIS — N1832 Chronic kidney disease, stage 3b: Secondary | ICD-10-CM | POA: Diagnosis not present

## 2021-11-16 DIAGNOSIS — I10 Essential (primary) hypertension: Secondary | ICD-10-CM | POA: Diagnosis not present

## 2021-11-16 DIAGNOSIS — E782 Mixed hyperlipidemia: Secondary | ICD-10-CM | POA: Diagnosis not present

## 2021-11-16 DIAGNOSIS — R232 Flushing: Secondary | ICD-10-CM | POA: Diagnosis not present

## 2021-11-16 DIAGNOSIS — L749 Eccrine sweat disorder, unspecified: Secondary | ICD-10-CM | POA: Diagnosis not present

## 2021-11-16 DIAGNOSIS — R739 Hyperglycemia, unspecified: Secondary | ICD-10-CM | POA: Diagnosis not present

## 2021-11-16 DIAGNOSIS — Z1211 Encounter for screening for malignant neoplasm of colon: Secondary | ICD-10-CM | POA: Diagnosis not present

## 2021-11-16 DIAGNOSIS — Z0000 Encounter for general adult medical examination without abnormal findings: Secondary | ICD-10-CM | POA: Diagnosis not present

## 2021-12-02 DIAGNOSIS — Z79899 Other long term (current) drug therapy: Secondary | ICD-10-CM | POA: Diagnosis not present

## 2021-12-02 DIAGNOSIS — Z0130 Encounter for examination of blood pressure without abnormal findings: Secondary | ICD-10-CM | POA: Diagnosis not present

## 2021-12-02 DIAGNOSIS — M48061 Spinal stenosis, lumbar region without neurogenic claudication: Secondary | ICD-10-CM | POA: Diagnosis not present

## 2021-12-02 DIAGNOSIS — M1712 Unilateral primary osteoarthritis, left knee: Secondary | ICD-10-CM | POA: Diagnosis not present

## 2021-12-02 DIAGNOSIS — R030 Elevated blood-pressure reading, without diagnosis of hypertension: Secondary | ICD-10-CM | POA: Diagnosis not present

## 2021-12-02 DIAGNOSIS — M19012 Primary osteoarthritis, left shoulder: Secondary | ICD-10-CM | POA: Diagnosis not present

## 2021-12-07 DIAGNOSIS — N3941 Urge incontinence: Secondary | ICD-10-CM | POA: Diagnosis not present

## 2022-01-02 DIAGNOSIS — D631 Anemia in chronic kidney disease: Secondary | ICD-10-CM | POA: Diagnosis not present

## 2022-01-02 DIAGNOSIS — I129 Hypertensive chronic kidney disease with stage 1 through stage 4 chronic kidney disease, or unspecified chronic kidney disease: Secondary | ICD-10-CM | POA: Diagnosis not present

## 2022-01-02 DIAGNOSIS — N2581 Secondary hyperparathyroidism of renal origin: Secondary | ICD-10-CM | POA: Diagnosis not present

## 2022-01-02 DIAGNOSIS — N1832 Chronic kidney disease, stage 3b: Secondary | ICD-10-CM | POA: Diagnosis not present

## 2022-01-04 DIAGNOSIS — N3941 Urge incontinence: Secondary | ICD-10-CM | POA: Diagnosis not present

## 2022-01-12 IMAGING — DX DG PORTABLE PELVIS
1 series · 1 of 1 positions shown · non-contrast
Comparison: Operative images from earlier today.

CLINICAL DATA: Postop from a left hip replacement.

EXAM:
PORTABLE PELVIS 1-2 VIEWS

[pelvis ap]
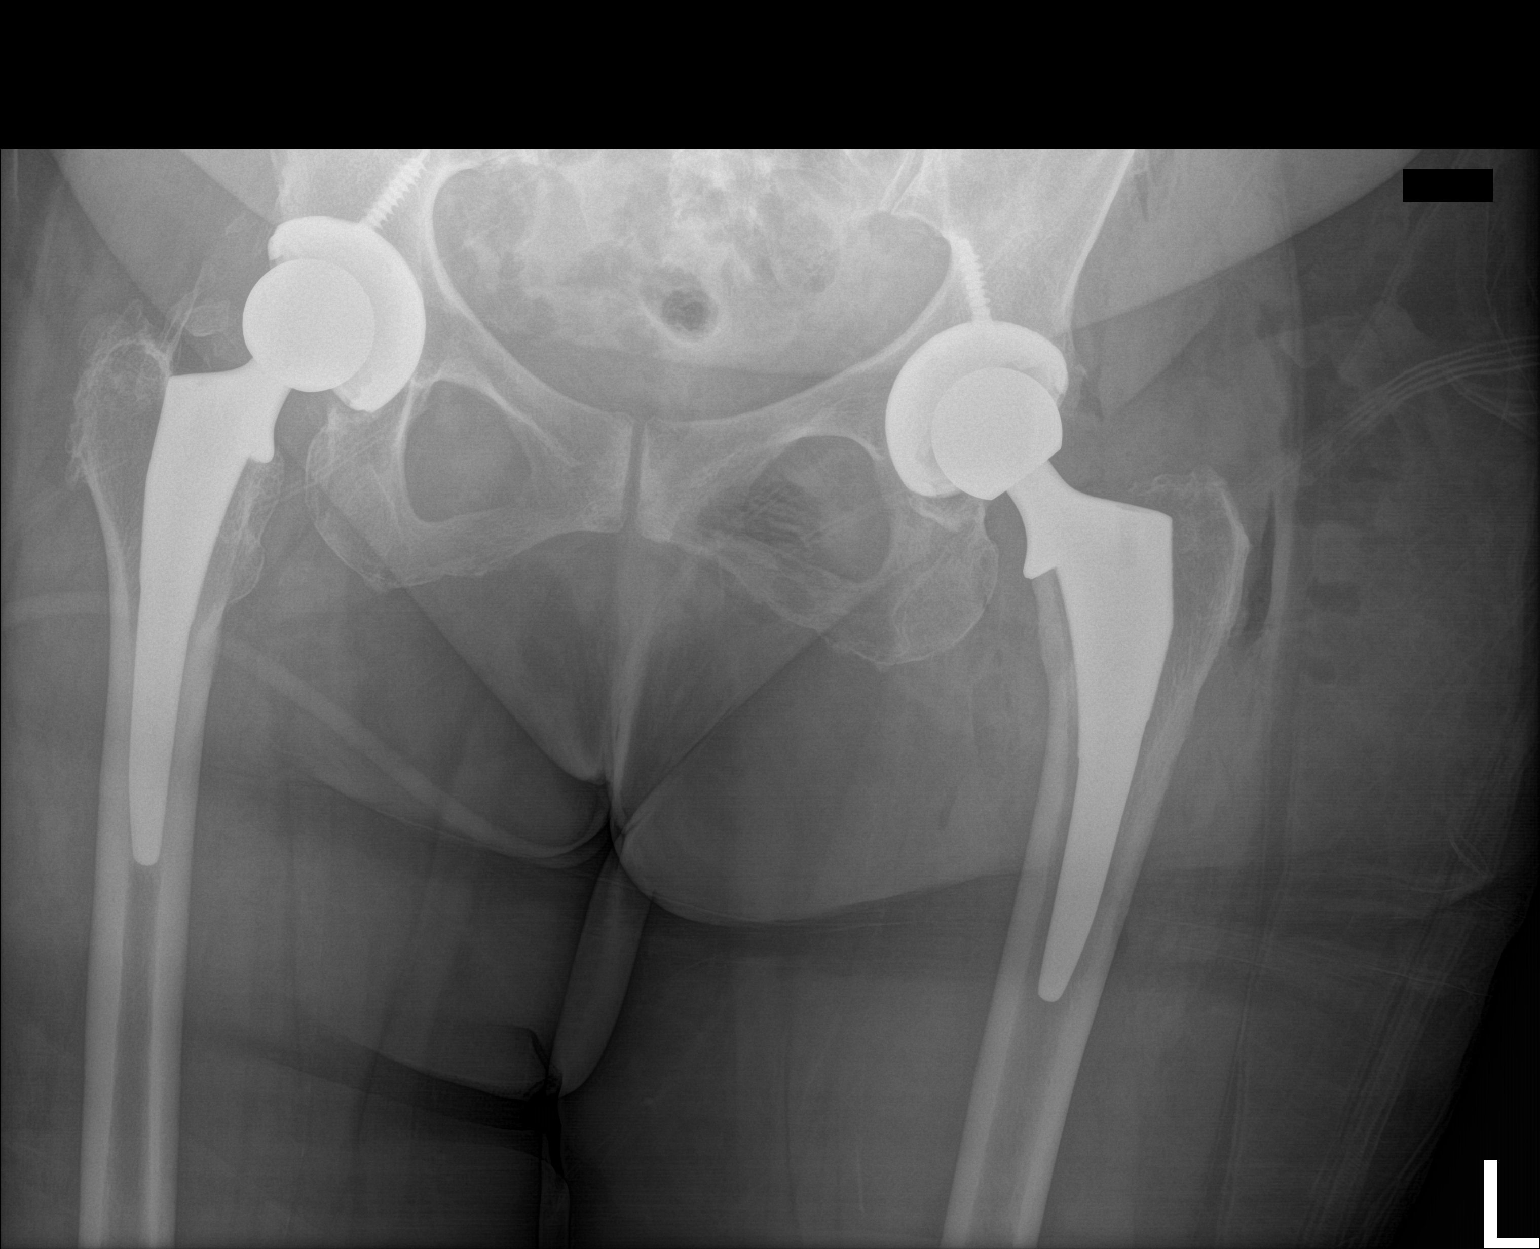

[1 of 1 positions shown; findings below may reference images not displayed]

FINDINGS: Acetabular and femoral components of the total left hip arthroplasty
appear well seated and well aligned. No acute fracture or evidence
of an operative complication.
IMPRESSION: Well-positioned total left hip arthroplasty.

## 2022-01-29 DIAGNOSIS — M1712 Unilateral primary osteoarthritis, left knee: Secondary | ICD-10-CM | POA: Diagnosis not present

## 2022-01-29 DIAGNOSIS — M25562 Pain in left knee: Secondary | ICD-10-CM | POA: Diagnosis not present

## 2022-02-01 DIAGNOSIS — N3941 Urge incontinence: Secondary | ICD-10-CM | POA: Diagnosis not present

## 2022-02-01 DIAGNOSIS — R350 Frequency of micturition: Secondary | ICD-10-CM | POA: Diagnosis not present

## 2022-03-01 DIAGNOSIS — N3941 Urge incontinence: Secondary | ICD-10-CM | POA: Diagnosis not present

## 2022-03-29 DIAGNOSIS — N3941 Urge incontinence: Secondary | ICD-10-CM | POA: Diagnosis not present

## 2022-04-19 DIAGNOSIS — R031 Nonspecific low blood-pressure reading: Secondary | ICD-10-CM | POA: Diagnosis not present

## 2022-04-19 DIAGNOSIS — M25562 Pain in left knee: Secondary | ICD-10-CM | POA: Diagnosis not present

## 2022-04-19 DIAGNOSIS — Z01818 Encounter for other preprocedural examination: Secondary | ICD-10-CM | POA: Diagnosis not present

## 2022-04-19 DIAGNOSIS — Z23 Encounter for immunization: Secondary | ICD-10-CM | POA: Diagnosis not present

## 2022-04-19 DIAGNOSIS — I10 Essential (primary) hypertension: Secondary | ICD-10-CM | POA: Diagnosis not present

## 2022-04-26 DIAGNOSIS — N3941 Urge incontinence: Secondary | ICD-10-CM | POA: Diagnosis not present

## 2022-04-27 DIAGNOSIS — I10 Essential (primary) hypertension: Secondary | ICD-10-CM | POA: Diagnosis not present

## 2022-04-27 DIAGNOSIS — N184 Chronic kidney disease, stage 4 (severe): Secondary | ICD-10-CM | POA: Diagnosis not present

## 2022-05-02 DIAGNOSIS — M19012 Primary osteoarthritis, left shoulder: Secondary | ICD-10-CM | POA: Diagnosis not present

## 2022-05-02 DIAGNOSIS — Z79899 Other long term (current) drug therapy: Secondary | ICD-10-CM | POA: Diagnosis not present

## 2022-05-02 DIAGNOSIS — Z789 Other specified health status: Secondary | ICD-10-CM | POA: Diagnosis not present

## 2022-05-02 DIAGNOSIS — M1712 Unilateral primary osteoarthritis, left knee: Secondary | ICD-10-CM | POA: Diagnosis not present

## 2022-05-02 DIAGNOSIS — Z96651 Presence of right artificial knee joint: Secondary | ICD-10-CM | POA: Diagnosis not present

## 2022-05-02 DIAGNOSIS — M48061 Spinal stenosis, lumbar region without neurogenic claudication: Secondary | ICD-10-CM | POA: Diagnosis not present

## 2022-05-02 DIAGNOSIS — R030 Elevated blood-pressure reading, without diagnosis of hypertension: Secondary | ICD-10-CM | POA: Diagnosis not present

## 2022-05-02 DIAGNOSIS — N1832 Chronic kidney disease, stage 3b: Secondary | ICD-10-CM | POA: Diagnosis not present

## 2022-05-02 DIAGNOSIS — Z0130 Encounter for examination of blood pressure without abnormal findings: Secondary | ICD-10-CM | POA: Diagnosis not present

## 2022-05-24 DIAGNOSIS — N3941 Urge incontinence: Secondary | ICD-10-CM | POA: Diagnosis not present

## 2022-05-28 DIAGNOSIS — I10 Essential (primary) hypertension: Secondary | ICD-10-CM | POA: Diagnosis not present

## 2022-05-28 DIAGNOSIS — N1831 Chronic kidney disease, stage 3a: Secondary | ICD-10-CM | POA: Diagnosis not present

## 2022-05-29 ENCOUNTER — Ambulatory Visit
Admission: RE | Admit: 2022-05-29 | Discharge: 2022-05-29 | Disposition: A | Discharge status: Home / Self Care | Payer: Medicare PPO | Source: Ambulatory Visit | Attending: Family Medicine | Admitting: Family Medicine

## 2022-05-29 DIAGNOSIS — E2839 Other primary ovarian failure: Secondary | ICD-10-CM

## 2022-05-29 DIAGNOSIS — Z780 Asymptomatic menopausal state: Secondary | ICD-10-CM | POA: Diagnosis not present

## 2022-05-31 DIAGNOSIS — N1832 Chronic kidney disease, stage 3b: Secondary | ICD-10-CM | POA: Diagnosis not present

## 2022-05-31 DIAGNOSIS — D631 Anemia in chronic kidney disease: Secondary | ICD-10-CM | POA: Diagnosis not present

## 2022-05-31 DIAGNOSIS — I129 Hypertensive chronic kidney disease with stage 1 through stage 4 chronic kidney disease, or unspecified chronic kidney disease: Secondary | ICD-10-CM | POA: Diagnosis not present

## 2022-05-31 DIAGNOSIS — N2581 Secondary hyperparathyroidism of renal origin: Secondary | ICD-10-CM | POA: Diagnosis not present

## 2022-05-31 DIAGNOSIS — N3281 Overactive bladder: Secondary | ICD-10-CM | POA: Diagnosis not present

## 2022-06-07 DIAGNOSIS — Z1231 Encounter for screening mammogram for malignant neoplasm of breast: Secondary | ICD-10-CM | POA: Diagnosis not present

## 2022-06-15 NOTE — Patient Instructions (Signed)
DUE TO COVID-19 ONLY TWO VISITORS  (aged 80 and older)  ARE ALLOWED TO COME WITH YOU AND STAY IN THE WAITING ROOM ONLY DURING PRE OP AND PROCEDURE.   **NO VISITORS ARE ALLOWED IN THE SHORT STAY AREA OR RECOVERY ROOM!!**  IF YOU WILL BE ADMITTED INTO THE HOSPITAL YOU ARE ALLOWED ONLY FOUR SUPPORT PEOPLE DURING VISITATION HOURS ONLY (7 AM -8PM)   The support person(s) must pass our screening, gel in and out, and wear a mask at all times, including in the patient's room. Patients must also wear a mask when staff or their support person are in the room. Visitors GUEST BADGE MUST BE WORN VISIBLY  One adult visitor may remain with you overnight and MUST be in the room by 8 P.M.     Your procedure is scheduled on: 07/03/22   Report to [facility] Main Entrance    Report to admitting at  5:15 AM   Call this number if you have problems the morning of surgery [phone]   Do not eat food :After Midnight.   After Midnight you may have the following liquids until _4:00 _____ AM/ DAY OF SURGERY  Water Black Coffee (sugar ok, NO MILK/CREAM OR CREAMERS)  Tea (sugar ok, NO MILK/CREAM OR CREAMERS) regular and decaf                             Plain Jell-O (NO RED)                                           Fruit ices (not with fruit pulp, NO RED)                                     Popsicles (NO RED)                                                                  Juice: apple, WHITE grape, WHITE cranberry Sports drinks like Gatorade (NO RED)                  The day of surgery:  Drink ONE (1) Pre-Surgery Clear Ensure  at 4:00 AM the morning of surgery. Drink in one sitting. Do not sip.  This drink was given to you during your hospital  pre-op appointment visit. Nothing else to drink after completing the  Pre-Surgery Clear Ensure at 4:15 AM          If you have questions, please contact your surgeon's office.   FOLLOW BOWEL PREP AND ANY ADDITIONAL PRE OP INSTRUCTIONS YOU RECEIVED  FROM YOUR SURGEON'S OFFICE!!!     Oral Hygiene is also important to reduce your risk of infection.                                    Remember - BRUSH YOUR TEETH THE MORNING OF SURGERY WITH YOUR REGULAR TOOTHPASTE   Do NOT smoke after Midnight   Take  these medicines the morning of surgery with A SIP OF WATER: Pain med if needed                                                                                                                            Pravastatin- Pravachol  .                              You may not have any metal on your body including hair pins, jewelry, and body piercing             Do not wear make-up, lotions, powders, perfumes/cologne, or deodorant  Do not wear nail polish including gel and S&S, artificial/acrylic nails, or any other type of covering on natural nails including finger and toenails. If you have artificial nails, gel coating, etc. that needs to be removed by a nail salon please have this removed prior to surgery or surgery may need to be canceled/ delayed if the surgeon/ anesthesia feels like they are unable to be safely monitored.   Do not shave  48 hours prior to surgery.     Do not bring valuables to the hospital. [place].   Contacts, dentures or bridgework may not be worn into surgery.   Bring small overnight bag day of surgery.   DO NOT [place]. PHARMACY WILL DISPENSE MEDICATIONS LISTED ON YOUR MEDICATION LIST TO YOU DURING YOUR ADMISSION [place]!    Patients discharged on the day of surgery will not be allowed to drive home.  Someone NEEDS to stay with you for the first 24 hours after anesthesia.   Special Instructions: Bring a copy of your healthcare power of attorney and living will documents  the day of surgery if you haven't scanned them before.              Please read over the following fact sheets you were given: IF YOU HAVE QUESTIONS ABOUT  YOUR PRE-OP INSTRUCTIONS PLEASE CALL [phone]    [facility] - Preparing for Surgery Before surgery, you can play an important role.  Because skin is not sterile, your skin needs to be as free of germs as possible.  You can reduce the number of germs on your skin by washing with CHG (chlorahexidine gluconate) soap before surgery.  CHG is an antiseptic cleaner which kills germs and bonds with the skin to continue killing germs even after washing. Please DO NOT use if you have an allergy to CHG or antibacterial soaps.  If your skin becomes reddened/irritated stop using the CHG and inform your nurse when you arrive at Short Stay. Do not shave (including legs and underarms) for at least 48 hours prior to the first CHG shower.   Please follow these instructions carefully:  1.  Shower with CHG Soap the night before surgery and the  morning of Surgery.  2.  If you choose to wash your hair, wash your hair first as usual with your  normal  shampoo.  3.  After you shampoo, rinse your hair and body thoroughly to remove the  shampoo.                            4.  Use CHG as you would any other liquid soap.  You can apply chg directly  to the skin and wash                       Gently with a scrungie or clean washcloth.  5.  Apply the CHG Soap to your body ONLY FROM THE NECK DOWN.   Do not use on face/ open                           Wound or open sores. Avoid contact with eyes, ears mouth and genitals (private parts).                       Wash face,  Genitals (private parts) with your normal soap.             6.  Wash thoroughly, paying special attention to the area where your surgery  will be performed.  7.  Thoroughly rinse your body with warm water from the neck down.  8.  DO NOT shower/wash with your normal soap after using and rinsing off  the CHG Soap.                9.  Pat yourself dry with a clean towel.            10.  Wear clean pajamas.            11.  Place clean sheets on your bed the  night of your first shower and do not  sleep with pets. Day of Surgery : Do not apply any lotions/deodorants the morning of surgery.  Please wear clean clothes to the hospital/surgery center.  FAILURE TO FOLLOW THESE INSTRUCTIONS MAY RESULT IN THE CANCELLATION OF YOUR SURGERY   ________________________________________________________________________  Incentive Spirometer  An incentive spirometer is a tool that can help keep your lungs clear and active. This tool measures how well you are filling your lungs with each breath. Taking long deep breaths may help reverse or decrease the chance of developing breathing (pulmonary) problems (especially infection) following: A long period of time when you are unable to move or be active. BEFORE THE PROCEDURE  If the spirometer includes an indicator to show your best effort, your nurse or respiratory therapist will set it to a desired goal. If possible, sit up straight or lean slightly forward. Try not to slouch. Hold the incentive spirometer in an upright position. INSTRUCTIONS FOR USE  Sit on the edge of your bed if possible, or sit up as far as you can in bed or on a chair. Hold the incentive spirometer in an upright position. Breathe out normally. Place the mouthpiece in your mouth and seal your lips tightly around it. Breathe in slowly and as deeply as possible, raising the piston or the ball toward the top of the column. Hold your breath for 3-5 seconds or for as long as possible. Allow the piston  or ball to fall to the bottom of the column. Remove the mouthpiece from your mouth and breathe out normally. Rest for a few seconds and repeat Steps 1 through 7 at least 10 times every 1-2 hours when you are awake. Take your time and take a few normal breaths between deep breaths. The spirometer may include an indicator to show your best effort. Use the indicator as a goal to work toward during each repetition. After each set of 10 deep breaths,  practice coughing to be sure your lungs are clear. If you have an incision (the cut made at the time of surgery), support your incision when coughing by placing a pillow or rolled up towels firmly against it. Once you are able to get out of bed, walk around indoors and cough well. You may stop using the incentive spirometer when instructed by your caregiver.  RISKS AND COMPLICATIONS Take your time so you do not get dizzy or light-headed. If you are in pain, you may need to take or ask for pain medication before doing incentive spirometry. It is harder to take a deep breath if you are having pain. AFTER USE Rest and breathe slowly and easily. It can be helpful to keep track of a log of your progress. Your caregiver can provide you with a simple table to help with this. If you are using the spirometer at home, follow these instructions: [place] IF:  You are having difficultly using the spirometer. You have trouble using the spirometer as often as instructed. Your pain medication is not giving enough relief while using the spirometer. You develop fever of 100.5 F (38.1 C) or higher. SEEK IMMEDIATE MEDICAL CARE IF:  You cough up bloody sputum that had not been present before. You develop fever of 102 F (38.9 C) or greater. You develop worsening pain at or near the incision site. MAKE SURE YOU:  Understand these instructions. Will watch your condition. Will get help right away if you are not doing well or get worse. Document Released: 12/03/2006 Document Revised: 10/15/2011 Document Reviewed: 02/03/2007 [facility] Patient Information 2014 [place], [place].   ________________________________________________________________________

## 2022-06-20 DIAGNOSIS — N6489 Other specified disorders of breast: Secondary | ICD-10-CM | POA: Diagnosis not present

## 2022-06-20 DIAGNOSIS — R928 Other abnormal and inconclusive findings on diagnostic imaging of breast: Secondary | ICD-10-CM | POA: Diagnosis not present

## 2022-06-21 ENCOUNTER — Other Ambulatory Visit: Payer: Self-pay

## 2022-06-21 ENCOUNTER — Encounter (HOSPITAL_COMMUNITY): Payer: Self-pay

## 2022-06-21 ENCOUNTER — Encounter (HOSPITAL_COMMUNITY)
Admission: RE | Admit: 2022-06-21 | Discharge: 2022-06-21 | Disposition: A | Discharge status: Home / Self Care | Payer: Medicare PPO | Source: Ambulatory Visit | Attending: Orthopedic Surgery | Admitting: Orthopedic Surgery

## 2022-06-21 VITALS — BP 135/66 | HR 81 | Temp 98.4°F | Resp 18 | Ht 63.0 in | Wt 194.0 lb

## 2022-06-21 DIAGNOSIS — R350 Frequency of micturition: Secondary | ICD-10-CM | POA: Diagnosis not present

## 2022-06-21 DIAGNOSIS — I10 Essential (primary) hypertension: Secondary | ICD-10-CM | POA: Insufficient documentation

## 2022-06-21 DIAGNOSIS — Z01818 Encounter for other preprocedural examination: Secondary | ICD-10-CM | POA: Insufficient documentation

## 2022-06-21 DIAGNOSIS — N3941 Urge incontinence: Secondary | ICD-10-CM | POA: Diagnosis not present

## 2022-06-21 LAB — BASIC METABOLIC PANEL
Anion gap: 7 (ref 5–15)
BUN: 25 mg/dL — ABNORMAL HIGH (ref 8–23)
CO2: 27 mmol/L (ref 22–32)
Calcium: 9.1 mg/dL (ref 8.9–10.3)
Chloride: 106 mmol/L (ref 98–111)
Creatinine, Ser: 1.59 mg/dL — ABNORMAL HIGH (ref 0.44–1.00)
GFR, Estimated: 33 mL/min — ABNORMAL LOW (ref >60)
Glucose, Bld: 99 mg/dL (ref 70–99)
Potassium: 3.4 mmol/L — ABNORMAL LOW (ref 3.5–5.1)
Sodium: 140 mmol/L (ref 135–145)

## 2022-06-21 LAB — CBC
HCT: 36.8 % (ref 36.0–46.0)
Hemoglobin: 11.7 g/dL — ABNORMAL LOW (ref 12.0–15.0)
MCH: 30.2 pg (ref 26.0–34.0)
MCHC: 31.8 g/dL (ref 30.0–36.0)
MCV: 95.1 fL (ref 80.0–100.0)
Platelets: 287 10*3/uL (ref 150–400)
RBC: 3.87 MIL/uL (ref 3.87–5.11)
RDW: 12 % (ref 11.5–15.5)
WBC: 9.4 10*3/uL (ref 4.0–10.5)
nRBC: 0 % (ref 0.0–0.2)

## 2022-06-21 LAB — SURGICAL PCR SCREEN
MRSA, PCR: NEGATIVE
Staphylococcus aureus: NEGATIVE

## 2022-06-21 NOTE — Progress Notes (Signed)
Anesthesia note:  Bowel prep reminder:  NA  PCP - Dr. Solon Palm Cardiologist -none Other-   Chest x-ray - no EKG - 06/21/22-chart Stress Test - no ECHO - no Cardiac Cath - no CABG-no Pacemaker/ICD device last checked:NA  Sleep Study - no CPAP -   Pt is pre diabetic-NA CBG at PAT visit- Fasting Blood Sugar at home- Checks Blood Sugar _____  Blood Thinner:no Blood Thinner Instructions: Aspirin Instructions: Last Dose:  Anesthesia review:  No    Patient denies shortness of breath, fever, cough and chest pain at PAT appointment Pt has no SOB with activities. She spinal stenosis and knee pain so she doesn't do much.   Patient verbalized understanding of instructions that were given to them at the PAT appointment. Patient was also instructed that they will need to review over the PAT instructions again at home before surgery.yes

## 2022-06-26 DIAGNOSIS — D242 Benign neoplasm of left breast: Secondary | ICD-10-CM | POA: Diagnosis not present

## 2022-06-26 DIAGNOSIS — N6321 Unspecified lump in the left breast, upper outer quadrant: Secondary | ICD-10-CM | POA: Diagnosis not present

## 2022-07-02 NOTE — Anesthesia Preprocedure Evaluation (Signed)
Anesthesia Evaluation  Patient identified by MRN, date of birth, ID band Patient awake    Reviewed: Allergy & Precautions, H&P , NPO status , Patient's Chart, lab work & pertinent test results  Airway Mallampati: II  TM Distance: >3 FB Neck ROM: Full    Dental no notable dental hx. (+) Teeth Intact, Dental Advisory Given   Pulmonary neg pulmonary ROS   Pulmonary exam normal breath sounds clear to auscultation       Cardiovascular Exercise Tolerance: Good hypertension, Pt. on medications  Rhythm:Regular Rate:Normal     Neuro/Psych negative neurological ROS  negative psych ROS   GI/Hepatic negative GI ROS, Neg liver ROS,,,  Endo/Other  negative endocrine ROS    Renal/GU Renal InsufficiencyRenal disease  negative genitourinary   Musculoskeletal  (+) Arthritis , Osteoarthritis,    Abdominal   Peds  Hematology  (+) Blood dyscrasia, anemia   Anesthesia Other Findings   Reproductive/Obstetrics negative OB ROS                             Anesthesia Physical Anesthesia Plan  ASA: 2  Anesthesia Plan: Spinal   Post-op Pain Management: Regional block* and Tylenol PO (pre-op)*   Induction: Intravenous  PONV Risk Score and Plan: 3 and Ondansetron, Propofol infusion and Treatment may vary due to age or medical condition  Airway Management Planned: Natural Airway and Simple Face Mask  Additional Equipment:   Intra-op Plan:   Post-operative Plan:   Informed Consent: I have reviewed the patients History and Physical, chart, labs and discussed the procedure including the risks, benefits and alternatives for the proposed anesthesia with the patient or authorized representative who has indicated his/her understanding and acceptance.     Dental advisory given  Plan Discussed with: CRNA  Anesthesia Plan Comments:        Anesthesia Quick Evaluation

## 2022-07-03 ENCOUNTER — Observation Stay (HOSPITAL_COMMUNITY)
Admission: RE | Admit: 2022-07-03 | Discharge: 2022-07-04 | Disposition: A | Discharge status: Home / Self Care | Payer: Medicare PPO | Attending: Orthopedic Surgery | Admitting: Orthopedic Surgery

## 2022-07-03 ENCOUNTER — Other Ambulatory Visit: Payer: Self-pay

## 2022-07-03 ENCOUNTER — Ambulatory Visit (HOSPITAL_BASED_OUTPATIENT_CLINIC_OR_DEPARTMENT_OTHER): Payer: Medicare PPO | Admitting: Anesthesiology

## 2022-07-03 ENCOUNTER — Encounter (HOSPITAL_COMMUNITY)
Admission: RE | Disposition: A | Discharge status: Home / Self Care | Payer: Self-pay | Source: Home / Self Care | Attending: Orthopedic Surgery

## 2022-07-03 ENCOUNTER — Encounter (HOSPITAL_COMMUNITY): Payer: Self-pay | Admitting: Orthopedic Surgery

## 2022-07-03 ENCOUNTER — Ambulatory Visit (HOSPITAL_COMMUNITY): Payer: Medicare PPO | Admitting: Anesthesiology

## 2022-07-03 DIAGNOSIS — M25762 Osteophyte, left knee: Secondary | ICD-10-CM | POA: Diagnosis not present

## 2022-07-03 DIAGNOSIS — M1712 Unilateral primary osteoarthritis, left knee: Principal | ICD-10-CM | POA: Insufficient documentation

## 2022-07-03 DIAGNOSIS — Z96652 Presence of left artificial knee joint: Secondary | ICD-10-CM

## 2022-07-03 DIAGNOSIS — Z96651 Presence of right artificial knee joint: Secondary | ICD-10-CM | POA: Diagnosis not present

## 2022-07-03 DIAGNOSIS — M25462 Effusion, left knee: Secondary | ICD-10-CM | POA: Diagnosis not present

## 2022-07-03 DIAGNOSIS — Z79899 Other long term (current) drug therapy: Secondary | ICD-10-CM | POA: Insufficient documentation

## 2022-07-03 DIAGNOSIS — N189 Chronic kidney disease, unspecified: Secondary | ICD-10-CM | POA: Insufficient documentation

## 2022-07-03 DIAGNOSIS — Z96643 Presence of artificial hip joint, bilateral: Secondary | ICD-10-CM | POA: Diagnosis not present

## 2022-07-03 DIAGNOSIS — I129 Hypertensive chronic kidney disease with stage 1 through stage 4 chronic kidney disease, or unspecified chronic kidney disease: Secondary | ICD-10-CM | POA: Diagnosis not present

## 2022-07-03 DIAGNOSIS — M659 Synovitis and tenosynovitis, unspecified: Secondary | ICD-10-CM | POA: Diagnosis not present

## 2022-07-03 DIAGNOSIS — G8918 Other acute postprocedural pain: Secondary | ICD-10-CM | POA: Diagnosis not present

## 2022-07-03 HISTORY — PX: TOTAL KNEE ARTHROPLASTY: SHX125

## 2022-07-03 SURGERY — ARTHROPLASTY, KNEE, TOTAL
Anesthesia: Spinal | Site: Knee | Laterality: Left

## 2022-07-03 MED ORDER — TRANEXAMIC ACID-NACL 1000-0.7 MG/100ML-% IV SOLN
1000.0000 mg | INTRAVENOUS | Status: AC
  Administered 2022-07-03: 1000 mg via INTRAVENOUS
  Filled 2022-07-03: qty 100

## 2022-07-03 MED ORDER — LACTATED RINGERS IV SOLN: INTRAVENOUS | Status: DC

## 2022-07-03 MED ORDER — FENTANYL CITRATE (PF) 100 MCG/2ML IJ SOLN
INTRAMUSCULAR | Status: AC
  Filled 2022-07-03: qty 2

## 2022-07-03 MED ORDER — KETOROLAC TROMETHAMINE 30 MG/ML IJ SOLN
INTRAMUSCULAR | Status: DC | PRN
  Administered 2022-07-03: 30 mg

## 2022-07-03 MED ORDER — PRAVASTATIN SODIUM 20 MG PO TABS
20.0000 mg | ORAL_TABLET | Freq: Every day | ORAL | Status: DC
  Administered 2022-07-04: 20 mg via ORAL
  Filled 2022-07-03: qty 1

## 2022-07-03 MED ORDER — ORAL CARE MOUTH RINSE: 15.0000 mL | Freq: Once | OROMUCOSAL | Status: AC

## 2022-07-03 MED ORDER — BUPIVACAINE-EPINEPHRINE (PF) 0.5% -1:200000 IJ SOLN
INTRAMUSCULAR | Status: AC
  Filled 2022-07-03: qty 30

## 2022-07-03 MED ORDER — ONDANSETRON HCL 4 MG/2ML IJ SOLN: 4.0000 mg | Freq: Four times a day (QID) | INTRAMUSCULAR | Status: DC | PRN

## 2022-07-03 MED ORDER — OXYCODONE HCL 5 MG PO TABS: 10.0000 mg | ORAL_TABLET | ORAL | Status: DC | PRN

## 2022-07-03 MED ORDER — FENTANYL CITRATE PF 50 MCG/ML IJ SOSY: 25.0000 ug | PREFILLED_SYRINGE | INTRAMUSCULAR | Status: DC | PRN

## 2022-07-03 MED ORDER — BISACODYL 10 MG RE SUPP: 10.0000 mg | Freq: Every day | RECTAL | Status: DC | PRN

## 2022-07-03 MED ORDER — DEXAMETHASONE SODIUM PHOSPHATE 10 MG/ML IJ SOLN
10.0000 mg | Freq: Once | INTRAMUSCULAR | Status: AC
  Administered 2022-07-04: 10 mg via INTRAVENOUS
  Filled 2022-07-03: qty 1

## 2022-07-03 MED ORDER — MENTHOL 3 MG MT LOZG: 1.0000 | LOZENGE | OROMUCOSAL | Status: DC | PRN

## 2022-07-03 MED ORDER — 0.9 % SODIUM CHLORIDE (POUR BTL) OPTIME
TOPICAL | Status: DC | PRN
  Administered 2022-07-03: 1000 mL

## 2022-07-03 MED ORDER — DIPHENHYDRAMINE HCL 12.5 MG/5ML PO ELIX: 12.5000 mg | ORAL_SOLUTION | ORAL | Status: DC | PRN

## 2022-07-03 MED ORDER — TRANEXAMIC ACID-NACL 1000-0.7 MG/100ML-% IV SOLN
1000.0000 mg | Freq: Once | INTRAVENOUS | Status: AC
  Administered 2022-07-03: 1000 mg via INTRAVENOUS
  Filled 2022-07-03: qty 100

## 2022-07-03 MED ORDER — ONDANSETRON HCL 4 MG PO TABS: 4.0000 mg | ORAL_TABLET | Freq: Four times a day (QID) | ORAL | Status: DC | PRN

## 2022-07-03 MED ORDER — CEFAZOLIN SODIUM-DEXTROSE 2-4 GM/100ML-% IV SOLN
2.0000 g | Freq: Four times a day (QID) | INTRAVENOUS | Status: AC
  Administered 2022-07-03 (×2): 2 g via INTRAVENOUS
  Filled 2022-07-03 (×2): qty 100

## 2022-07-03 MED ORDER — SODIUM CHLORIDE (PF) 0.9 % IJ SOLN
INTRAMUSCULAR | Status: DC | PRN
  Administered 2022-07-03: 30 mL

## 2022-07-03 MED ORDER — DOCUSATE SODIUM 100 MG PO CAPS
100.0000 mg | ORAL_CAPSULE | Freq: Two times a day (BID) | ORAL | Status: DC
  Administered 2022-07-04: 100 mg via ORAL
  Filled 2022-07-03 (×2): qty 1

## 2022-07-03 MED ORDER — AMLODIPINE BESYLATE 5 MG PO TABS
5.0000 mg | ORAL_TABLET | Freq: Every morning | ORAL | Status: DC
  Administered 2022-07-04: 5 mg via ORAL
  Filled 2022-07-03: qty 1

## 2022-07-03 MED ORDER — SODIUM CHLORIDE (PF) 0.9 % IJ SOLN
INTRAMUSCULAR | Status: AC
  Filled 2022-07-03: qty 30

## 2022-07-03 MED ORDER — METHOCARBAMOL 500 MG IVPB - SIMPLE MED: 500.0000 mg | Freq: Four times a day (QID) | INTRAVENOUS | Status: DC | PRN

## 2022-07-03 MED ORDER — PROPOFOL 500 MG/50ML IV EMUL
INTRAVENOUS | Status: DC | PRN
  Administered 2022-07-03: 75 ug/kg/min via INTRAVENOUS

## 2022-07-03 MED ORDER — HYDROMORPHONE HCL 1 MG/ML IJ SOLN: 0.5000 mg | INTRAMUSCULAR | Status: DC | PRN

## 2022-07-03 MED ORDER — POVIDONE-IODINE 10 % EX SWAB
2.0000 | Freq: Once | CUTANEOUS | Status: AC
  Administered 2022-07-03: 2 via TOPICAL

## 2022-07-03 MED ORDER — DEXAMETHASONE SODIUM PHOSPHATE 10 MG/ML IJ SOLN
INTRAMUSCULAR | Status: AC
  Filled 2022-07-03: qty 1

## 2022-07-03 MED ORDER — MIDAZOLAM HCL 2 MG/2ML IJ SOLN
INTRAMUSCULAR | Status: AC
  Filled 2022-07-03: qty 2

## 2022-07-03 MED ORDER — ACETAMINOPHEN 10 MG/ML IV SOLN
INTRAVENOUS | Status: AC
  Filled 2022-07-03: qty 100

## 2022-07-03 MED ORDER — OXYCODONE HCL 5 MG PO TABS
5.0000 mg | ORAL_TABLET | ORAL | Status: DC | PRN
  Administered 2022-07-03 – 2022-07-04 (×2): 5 mg via ORAL
  Administered 2022-07-04: 10 mg via ORAL
  Filled 2022-07-03: qty 1
  Filled 2022-07-03 (×2): qty 2

## 2022-07-03 MED ORDER — METOCLOPRAMIDE HCL 5 MG PO TABS: 5.0000 mg | ORAL_TABLET | Freq: Three times a day (TID) | ORAL | Status: DC | PRN

## 2022-07-03 MED ORDER — PHENOL 1.4 % MT LIQD: 1.0000 | OROMUCOSAL | Status: DC | PRN

## 2022-07-03 MED ORDER — ONDANSETRON HCL 4 MG/2ML IJ SOLN
INTRAMUSCULAR | Status: DC | PRN
  Administered 2022-07-03: 4 mg via INTRAVENOUS

## 2022-07-03 MED ORDER — ACETAMINOPHEN 325 MG PO TABS: 325.0000 mg | ORAL_TABLET | Freq: Four times a day (QID) | ORAL | Status: DC | PRN

## 2022-07-03 MED ORDER — MIDAZOLAM HCL 2 MG/2ML IJ SOLN
INTRAMUSCULAR | Status: DC | PRN
  Administered 2022-07-03: 1 mg via INTRAVENOUS

## 2022-07-03 MED ORDER — SODIUM CHLORIDE 0.9 % IV SOLN: INTRAVENOUS | Status: DC

## 2022-07-03 MED ORDER — STERILE WATER FOR IRRIGATION IR SOLN
Status: DC | PRN
  Administered 2022-07-03: 2000 mL

## 2022-07-03 MED ORDER — PHENYLEPHRINE 80 MCG/ML (10ML) SYRINGE FOR IV PUSH (FOR BLOOD PRESSURE SUPPORT)
PREFILLED_SYRINGE | INTRAVENOUS | Status: DC | PRN
  Administered 2022-07-03: 80 ug via INTRAVENOUS

## 2022-07-03 MED ORDER — SODIUM CHLORIDE 0.9 % IR SOLN
Status: DC | PRN
  Administered 2022-07-03: 1000 mL

## 2022-07-03 MED ORDER — METOCLOPRAMIDE HCL 5 MG/ML IJ SOLN: 5.0000 mg | Freq: Three times a day (TID) | INTRAMUSCULAR | Status: DC | PRN

## 2022-07-03 MED ORDER — BUPIVACAINE-EPINEPHRINE (PF) 0.5% -1:200000 IJ SOLN
INTRAMUSCULAR | Status: DC | PRN
  Administered 2022-07-03: 20 mL via PERINEURAL

## 2022-07-03 MED ORDER — KETOROLAC TROMETHAMINE 30 MG/ML IJ SOLN
INTRAMUSCULAR | Status: AC
  Filled 2022-07-03: qty 1

## 2022-07-03 MED ORDER — PROPOFOL 10 MG/ML IV BOLUS
INTRAVENOUS | Status: DC | PRN
  Administered 2022-07-03: 20 mg via INTRAVENOUS
  Administered 2022-07-03: 10 mg via INTRAVENOUS
  Administered 2022-07-03: 20 mg via INTRAVENOUS

## 2022-07-03 MED ORDER — ASPIRIN 81 MG PO CHEW
81.0000 mg | CHEWABLE_TABLET | Freq: Two times a day (BID) | ORAL | Status: DC
  Administered 2022-07-03 – 2022-07-04 (×2): 81 mg via ORAL
  Filled 2022-07-03 (×2): qty 1

## 2022-07-03 MED ORDER — POLYETHYLENE GLYCOL 3350 17 G PO PACK
17.0000 g | PACK | Freq: Two times a day (BID) | ORAL | Status: DC
  Filled 2022-07-03: qty 1

## 2022-07-03 MED ORDER — PROPOFOL 1000 MG/100ML IV EMUL
INTRAVENOUS | Status: AC
  Filled 2022-07-03: qty 100

## 2022-07-03 MED ORDER — CEFAZOLIN SODIUM-DEXTROSE 2-4 GM/100ML-% IV SOLN
2.0000 g | INTRAVENOUS | Status: AC
  Administered 2022-07-03: 2 g via INTRAVENOUS
  Filled 2022-07-03: qty 100

## 2022-07-03 MED ORDER — FENTANYL CITRATE (PF) 100 MCG/2ML IJ SOLN
INTRAMUSCULAR | Status: DC | PRN
  Administered 2022-07-03: 50 ug via INTRAVENOUS

## 2022-07-03 MED ORDER — LISINOPRIL 20 MG PO TABS
20.0000 mg | ORAL_TABLET | Freq: Every day | ORAL | Status: DC
  Administered 2022-07-04: 20 mg via ORAL
  Filled 2022-07-03: qty 1

## 2022-07-03 MED ORDER — BUPIVACAINE IN DEXTROSE 0.75-8.25 % IT SOLN
INTRATHECAL | Status: DC | PRN
  Administered 2022-07-03: 1.6 mL via INTRATHECAL

## 2022-07-03 MED ORDER — METHOCARBAMOL 500 MG PO TABS
500.0000 mg | ORAL_TABLET | Freq: Four times a day (QID) | ORAL | Status: DC | PRN
  Administered 2022-07-03 – 2022-07-04 (×2): 500 mg via ORAL
  Filled 2022-07-03 (×2): qty 1

## 2022-07-03 MED ORDER — ACETAMINOPHEN 500 MG PO TABS
1000.0000 mg | ORAL_TABLET | Freq: Four times a day (QID) | ORAL | Status: DC
  Administered 2022-07-03 – 2022-07-04 (×3): 1000 mg via ORAL
  Filled 2022-07-03 (×3): qty 2

## 2022-07-03 MED ORDER — DEXAMETHASONE SODIUM PHOSPHATE 10 MG/ML IJ SOLN
8.0000 mg | Freq: Once | INTRAMUSCULAR | Status: AC
  Administered 2022-07-03: 8 mg via INTRAVENOUS

## 2022-07-03 MED ORDER — PROPOFOL 10 MG/ML IV BOLUS
INTRAVENOUS | Status: AC
  Filled 2022-07-03: qty 20

## 2022-07-03 MED ORDER — ONDANSETRON HCL 4 MG/2ML IJ SOLN
INTRAMUSCULAR | Status: AC
  Filled 2022-07-03: qty 2

## 2022-07-03 MED ORDER — BUPIVACAINE-EPINEPHRINE (PF) 0.5% -1:200000 IJ SOLN
INTRAMUSCULAR | Status: DC | PRN
  Administered 2022-07-03: 30 mL

## 2022-07-03 MED ORDER — ACETAMINOPHEN 10 MG/ML IV SOLN
INTRAVENOUS | Status: DC | PRN
  Administered 2022-07-03: 1000 mg via INTRAVENOUS

## 2022-07-03 MED ORDER — CHLORHEXIDINE GLUCONATE 0.12 % MT SOLN
15.0000 mL | Freq: Once | OROMUCOSAL | Status: AC
  Administered 2022-07-03: 15 mL via OROMUCOSAL

## 2022-07-03 SURGICAL SUPPLY — 48 items
ATTUNE MED ANAT PAT 32 KNEE (Knees) IMPLANT
ATTUNE PSFEM LTSZ4 NARCEM KNEE (Femur) IMPLANT
ATTUNE PSRP INSR SZ4 8 KNEE (Insert) IMPLANT
BAG COUNTER SPONGE SURGICOUNT (BAG) IMPLANT
BAG ZIPLOCK 12X15 (MISCELLANEOUS) IMPLANT
BASEPLATE TIBIAL ROTATING SZ 4 (Knees) IMPLANT
BLADE SAW SGTL 11.0X1.19X90.0M (BLADE) IMPLANT
BLADE SAW SGTL 13.0X1.19X90.0M (BLADE) ×1 IMPLANT
BNDG ELASTIC 6X5.8 VLCR STR LF (GAUZE/BANDAGES/DRESSINGS) ×1 IMPLANT
BOWL SMART MIX CTS (DISPOSABLE) ×1 IMPLANT
CEMENT HV SMART SET (Cement) IMPLANT
CUFF TOURN SGL QUICK 34 (TOURNIQUET CUFF) ×1
CUFF TRNQT CYL 34X4.125X (TOURNIQUET CUFF) ×1 IMPLANT
DERMABOND ADVANCED .7 DNX12 (GAUZE/BANDAGES/DRESSINGS) ×1 IMPLANT
DRAPE U-SHAPE 47X51 STRL (DRAPES) ×1 IMPLANT
DRESSING AQUACEL AG SP 3.5X10 (GAUZE/BANDAGES/DRESSINGS) ×1 IMPLANT
DRSG AQUACEL AG SP 3.5X10 (GAUZE/BANDAGES/DRESSINGS) ×1
DURAPREP 26ML APPLICATOR (WOUND CARE) ×2 IMPLANT
ELECT REM PT RETURN 15FT ADLT (MISCELLANEOUS) ×1 IMPLANT
GLOVE BIO SURGEON STRL SZ 6 (GLOVE) ×1 IMPLANT
GLOVE BIOGEL PI IND STRL 6.5 (GLOVE) ×1 IMPLANT
GLOVE BIOGEL PI IND STRL 7.5 (GLOVE) ×1 IMPLANT
GLOVE ORTHO TXT STRL SZ7.5 (GLOVE) ×2 IMPLANT
GOWN STRL REUS W/ TWL LRG LVL3 (GOWN DISPOSABLE) ×2 IMPLANT
GOWN STRL REUS W/TWL LRG LVL3 (GOWN DISPOSABLE) ×2
HANDPIECE INTERPULSE COAX TIP (DISPOSABLE) ×1
HOLDER FOLEY CATH W/STRAP (MISCELLANEOUS) IMPLANT
KIT TURNOVER KIT A (KITS) IMPLANT
MANIFOLD NEPTUNE II (INSTRUMENTS) ×1 IMPLANT
NDL SAFETY ECLIP 18X1.5 (MISCELLANEOUS) IMPLANT
NS IRRIG 1000ML POUR BTL (IV SOLUTION) ×1 IMPLANT
PACK TOTAL KNEE CUSTOM (KITS) ×1 IMPLANT
PIN FIX SIGMA LCS THRD HI (PIN) IMPLANT
PROTECTOR NERVE ULNAR (MISCELLANEOUS) ×1 IMPLANT
SET HNDPC FAN SPRY TIP SCT (DISPOSABLE) ×1 IMPLANT
SET PAD KNEE POSITIONER (MISCELLANEOUS) ×1 IMPLANT
SPIKE FLUID TRANSFER (MISCELLANEOUS) ×2 IMPLANT
SUT MNCRL AB 4-0 PS2 18 (SUTURE) ×1 IMPLANT
SUT STRATAFIX PDS+ 0 24IN (SUTURE) ×1 IMPLANT
SUT VIC AB 1 CT1 36 (SUTURE) ×1 IMPLANT
SUT VIC AB 2-0 CT1 27 (SUTURE) ×2
SUT VIC AB 2-0 CT1 TAPERPNT 27 (SUTURE) ×2 IMPLANT
SYR 3ML LL SCALE MARK (SYRINGE) ×1 IMPLANT
TOWEL GREEN STERILE FF (TOWEL DISPOSABLE) ×1 IMPLANT
TRAY FOLEY MTR SLVR 16FR STAT (SET/KITS/TRAYS/PACK) ×1 IMPLANT
TUBE SUCTION HIGH CAP CLEAR NV (SUCTIONS) ×1 IMPLANT
WATER STERILE IRR 1000ML POUR (IV SOLUTION) ×2 IMPLANT
WRAP KNEE MAXI GEL POST OP (GAUZE/BANDAGES/DRESSINGS) ×1 IMPLANT

## 2022-07-03 NOTE — Transfer of Care (Signed)
Immediate Anesthesia Transfer of Care Note  Patient: GOLDIA LIGMAN  Procedure(s) Performed: TOTAL KNEE ARTHROPLASTY (Left: Knee)  Patient Location: PACU  Anesthesia Type:Spinal  Level of Consciousness: awake, alert , and oriented  Airway & Oxygen Therapy: Patient Spontanous Breathing and Patient connected to face mask oxygen  Post-op Assessment: Report given to RN and Post -op Vital signs reviewed and stable  Post vital signs: Reviewed and stable  Last Vitals:  Vitals Value Taken Time  BP 101/87   Temp    Pulse 80 07/03/22 0908  Resp 13 07/03/22 0908  SpO2 97 % 07/03/22 0908  Vitals shown include unvalidated device data.  Last Pain:  Vitals:   07/03/22 0557  TempSrc:   PainSc: 5          Complications: No notable events documented.
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
## 2022-07-03 NOTE — Anesthesia Procedure Notes (Signed)
Anesthesia Regional Block: Adductor canal block   Pre-Anesthetic Checklist: , timeout performed,  Correct Patient, Correct Site, Correct Laterality,  Correct Procedure, Correct Position, site marked,  Risks and benefits discussed,  Pre-op evaluation,  At surgeon's request and post-op pain management  Laterality: Left  Prep: Maximum Sterile Barrier Precautions used, chloraprep       Needles:  Injection technique: Single-shot  Needle Type: Echogenic Stimulator Needle     Needle Length: 9cm  Needle Gauge: 21     Additional Needles:   Procedures:,,,, ultrasound used (permanent image in chart),,    Narrative:  Start time: 07/03/2022 6:48 AM End time: 07/03/2022 6:58 AM Injection made incrementally with aspirations every 5 mL.  Performed by: Personally  Anesthesiologist: Roderic Palau, MD

## 2022-07-03 NOTE — Interval H&P Note (Signed)
History and Physical Interval Note:  07/03/2022 7:06 AM  Sharon Fitzgerald  has presented today for surgery, with the diagnosis of Left knee osteoarthritis.  The various methods of treatment have been discussed with the patient and family. After consideration of risks, benefits and other options for treatment, the patient has consented to  Procedure(s): TOTAL KNEE ARTHROPLASTY (Left) as a surgical intervention.  The patient's history has been reviewed, patient examined, no change in status, stable for surgery.  I have reviewed the patient's chart and labs.  Questions were answered to the patient's satisfaction.     [name]

## 2022-07-03 NOTE — Op Note (Signed)
NAME:  Sharon Fitzgerald RECORD NO.:  [id number]                             FACILITY:  [facility]      PHYSICIAN:  Pietro Cassis. Alvan Dame, M.D.  DATE OF BIRTH:  Sep 01, 1940      DATE OF PROCEDURE:  07/03/2022                                     OPERATIVE REPORT         PREOPERATIVE DIAGNOSIS:  Left knee osteoarthritis.      POSTOPERATIVE DIAGNOSIS:  Left knee osteoarthritis.      FINDINGS:  The patient was noted to have complete loss of cartilage and   bone-on-bone arthritis with associated osteophytes in the lateral and patellofemoral compartments of   the knee with a significant synovitis and associated effusion.  The patient had failed months of conservative treatment including medications, injection therapy, activity modification.     PROCEDURE:  Left total knee replacement.      COMPONENTS USED:  DePuy Attune rotating platform posterior stabilized knee   system, a size 4N femur, 4 tibia, size 8 mm PS AOX insert, and 32 anatomic patellar   button.      SURGEON:  Pietro Cassis. Alvan Dame, M.D.      ASSISTANT:  Costella Hatcher, PA-C.      ANESTHESIA:  Regional and Spinal.      SPECIMENS:  None.      COMPLICATION:  None.      DRAINS:  None.  EBL: <150 cc      TOURNIQUET TIME:   Total Tourniquet Time Documented: Thigh (Right) - 29 minutes Total: Thigh (Right) - 29 minutes  .      The patient was stable to the recovery room.      INDICATION FOR PROCEDURE:  Sharon Fitzgerald is a 81 y.o. female patient of   mine.  The patient had been seen, evaluated, and treated for months conservatively in the   office with medication, activity modification, and injections.  The patient had   radiographic changes of bone-on-bone arthritis with endplate sclerosis and osteophytes noted.  Based on the radiographic changes and failed conservative measures, the patient   decided to proceed with definitive treatment, total knee replacement.  Risks of infection, DVT, component failure,  need for revision surgery, neurovascular injury were reviewed in the office setting.  The postop course was reviewed stressing the efforts to maximize post-operative satisfaction and function.  Consent was obtained for benefit of pain   relief.      PROCEDURE IN DETAIL:  The patient was brought to the operative theater.   Once adequate anesthesia, preoperative antibiotics, 2 gm of Ancef,1 gm of Tranexamic Acid, and 10 mg of Decadron administered, the patient was positioned supine with a left thigh tourniquet placed.  The  left lower extremity was prepped and draped in sterile fashion.  A time-   out was performed identifying the patient, planned procedure, and the appropriate extremity.      The left lower extremity was placed in the [facility] leg holder.  The leg was   exsanguinated, tourniquet elevated to 250 mmHg.  A midline incision was  made followed by median parapatellar arthrotomy.  Following initial   exposure, attention was first directed to the patella.  Precut   measurement was noted to be 22 mm.  I resected down to 13 mm and used a   32 anatomic patellar button to restore patellar height as well as cover the cut surface.      The lug holes were drilled and a metal shim was placed to protect the   patella from retractors and saw blade during the procedure.      At this point, attention was now directed to the femur.  The femoral   canal was opened with a drill, irrigated to try to prevent fat emboli.  An   intramedullary rod was passed at 5 degrees valgus, 11 mm of bone was   resected off the distal femur.  Following this resection, the tibia was   subluxated anteriorly.  Using the extramedullary guide, 3 mm of bone was resected off   the proximal lateral tibia.  We confirmed the gap would be   stable medially and laterally with a size 6 spacer block as well as confirmed that the tibial cut was perpendicular in the coronal plane, checking with an alignment rod.      Once this was  done, I sized the femur to be a size 4 in the anterior-   posterior dimension, chose a narrow component based on medial and   lateral dimension.  The size 4 rotation block was then pinned in   position anterior referenced using the C-clamp to set rotation.  The   anterior, posterior, and  chamfer cuts were made without difficulty nor   notching making certain that I was along the anterior cortex to help   with flexion gap stability.      The final box cut was made off the lateral aspect of distal femur.      At this point, the tibia was sized to be a size 4.  The size 4 tray was   then pinned in position through the medial third of the tubercle,   drilled, and keel punched.  Trial reduction was now carried with a 4 femur,  4 tibia, a size 8 mm PS insert, and the 32 anatomic patella botton.  The knee was brought to full extension with good flexion stability with the patella   tracking through the trochlea without application of pressure.  Given   all these findings the trial components removed.  Final components were   opened and cement was mixed.  The knee was irrigated with normal saline solution and pulse lavage.  The synovial lining was   then injected with 30 cc of 0.25% Marcaine with epinephrine, 1 cc of Toradol and 30 cc of NS for a total of 61 cc.     Final implants were then cemented onto cleaned and dried cut surfaces of bone with the knee brought to extension with a size 8 mm PS trial insert.      Once the cement had fully cured, excess cement was removed   throughout the knee.  I confirmed that I was satisfied with the range of   motion and stability, and the final size 8 mm PS AOX insert was chosen.  It was   placed into the knee.      The tourniquet had been let down at 29 minutes.  No significant   hemostasis was required.  The extensor mechanism was then reapproximated using #1 Vicryl  and #1 Stratafix sutures with the knee   in flexion.  The   remaining wound was closed  with 2-0 Vicryl and running 4-0 Monocryl.   The knee was cleaned, dried, dressed sterilely using Dermabond and   Aquacel dressing.  The patient was then   brought to recovery room in stable condition, tolerating the procedure   well.   Please note that Physician Assistant, Costella Hatcher, PA-C was present for the entirety of the case, and was utilized for pre-operative positioning, peri-operative retractor management, general facilitation of the procedure and for primary wound closure at the end of the case.              Pietro Cassis Alvan Dame, M.D.    07/03/2022 8:38 AM

## 2022-07-03 NOTE — Discharge Instructions (Signed)

## 2022-07-03 NOTE — Anesthesia Postprocedure Evaluation (Signed)
Anesthesia Post Note  Patient: Sharon Fitzgerald  Procedure(s) Performed: TOTAL KNEE ARTHROPLASTY (Left: Knee)     Patient location during evaluation: PACU Anesthesia Type: Spinal and Regional Level of consciousness: oriented and awake and alert Pain management: pain level controlled Vital Signs Assessment: post-procedure vital signs reviewed and stable Respiratory status: spontaneous breathing and respiratory function stable Cardiovascular status: blood pressure returned to baseline and stable Postop Assessment: no headache, no backache, no apparent nausea or vomiting, spinal receding and patient able to bend at knees Anesthetic complications: no  No notable events documented.  Last Vitals:  Vitals:   07/03/22 1015 07/03/22 1030  BP: 121/88 (!) 141/76  Pulse: 69 67  Resp: 14 16  Temp: (!) 36.4 C 36.7 C  SpO2: 99% 92%    Last Pain:  Vitals:   07/03/22 1030  TempSrc: Oral  PainSc: 0-No pain                 Powell Halbert,W. EDMOND

## 2022-07-03 NOTE — Anesthesia Procedure Notes (Signed)
Procedure Name: MAC Date/Time: 07/03/2022 7:15 AM  Performed by: Niel Hummer, CRNAPre-anesthesia Checklist: Patient identified, Emergency Drugs available, Suction available and Patient being monitored Oxygen Delivery Method: Simple face mask

## 2022-07-03 NOTE — Anesthesia Procedure Notes (Signed)
Spinal  Patient location during procedure: OR Start time: 07/03/2022 7:18 AM End time: 07/03/2022 7:24 AM Reason for block: surgical anesthesia Staffing Performed: anesthesiologist  Anesthesiologist: Roderic Palau, MD Performed by: Roderic Palau, MD Authorized by: Roderic Palau, MD   Preanesthetic Checklist Completed: patient identified, IV checked, risks and benefits discussed, surgical consent, monitors and equipment checked, pre-op evaluation and timeout performed Spinal Block Patient position: sitting Prep: DuraPrep Patient monitoring: cardiac monitor, continuous pulse ox and blood pressure Approach: right paramedian Location: L3-4 Injection technique: single-shot Needle Needle type: Quincke  Needle gauge: 22 G Needle length: 9 cm Assessment Sensory level: T8 Events: CSF return Additional Notes Functioning IV was confirmed and monitors were applied. Sterile prep and drape, including hand hygiene and sterile gloves were used. The patient was positioned and the spine was prepped. The skin was anesthetized with lidocaine.  Free flow of clear CSF was obtained prior to injecting local anesthetic into the CSF.  The spinal needle aspirated freely following injection.  The needle was carefully withdrawn.  The patient tolerated the procedure well.

## 2022-07-03 NOTE — Evaluation (Addendum)
Physical Therapy Evaluation Patient Details Name: Sharon Fitzgerald MRN: [id number] DOB: Jul 29, 1941 Today's Date: 07/03/2022  History of Present Illness  Sharon Fitzgerald is an 81 yo female s/p L TKA 11/28. PMH: arthritis, CKD, HTN, obese, spinal stenosis, R THA 01/2019, L THA 12/2019 and R TKA 01/2013   Clinical Impression  Pt is s/p L TKA resulting in the deficits listed below (see PT Problem List). 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 Pt POD0, able to mobilize LLE to EOB without physical assist, therapist managing lines and cords for supine to sit. While this therapist retrieving RW and gait belt, pt reached forward for RW, lost balance and lowered to R forearm plank position on recliner, unable to assist pt up in this position so therapist transitioned pt to sitting position on floor; RN Jen enterred room to assist pt to standing. L knee was not stressed and head was not hit during incident. Pt denies dizziness, pain, SOB and able to perform standing hip swaying and marching in place without knee buckling noted. Pt amb 40 ft with RW, min guard, no knee buckling noted. Once returned to room, pt instructed on benefit of ankle pumps and able to perform correctly. Pt with all DME needs, daughter and son's mother in law will be present to assist, and reports having OPPT scheduled. Pt will benefit from skilled PT to increase their independence and safety with mobility to allow discharge to the venue listed below.         Recommendations for follow up therapy are one component of a multi-disciplinary discharge planning process, led by the attending physician.  Recommendations may be updated based on patient status, additional functional criteria and insurance authorization.  Follow Up Recommendations Follow physician's recommendations for discharge plan and follow up therapies      Assistance  Recommended at Discharge Frequent or constant Supervision/Assistance  Patient can return home with the following  A little help with walking and/or transfers;A little help with bathing/dressing/bathroom;Assistance with cooking/housework;Assist for transportation;Help with stairs or ramp for entrance    Equipment Recommendations None recommended by PT  Recommendations for Other Services       Functional Status Assessment Patient has had a recent decline in their functional status and demonstrates the ability to make significant improvements in function in a reasonable and predictable amount of time.     Precautions / Restrictions        Mobility  Bed Mobility Overal bed mobility: Needs Assistance Bed Mobility: Supine to Sit  Supine to sit: Min guard  General bed mobility comments: pt using bedrail to assist in uprighting trunk and scoot out to EOB    Transfers Overall transfer level: Needs assistance Equipment used: Rolling walker (2 wheels) Transfers: Sit to/from Stand Sit to Stand: Min assist  General transfer comment: min A to steady, VC for bil foot placement and pushing from seated surface to rise    Ambulation/Gait Ambulation/Gait assistance: Min guard Gait Distance (Feet): 40 Feet Assistive device: Rolling walker (2 wheels) Gait Pattern/deviations: Step-to pattern, Decreased stride length, Decreased stance time - left Gait velocity: decreased  General Gait Details: setp-to pattern with decreased LLE stance time, no knee buckling  Stairs            Wheelchair Mobility    Modified Rankin (Stroke Patients Only)       Balance Overall balance assessment: Needs assistance Sitting-balance support: Feet  supported Sitting balance-Leahy Scale: Good  Standing balance support: During functional activity, Bilateral upper extremity supported, Reliant on assistive device for balance Standing balance-Leahy Scale: Poor       Pertinent Vitals/Pain Pain  Assessment Pain Assessment: 0-10 Pain Score: 3  Pain Location: L knee Pain Descriptors / Indicators: Sore Pain Intervention(s): Limited activity within patient's tolerance, Monitored during session, Premedicated before session, Repositioned, Ice applied    Home Living Family/patient expects to be discharged to:: Private residence Living Arrangements: Children Available Help at Discharge: Family;Available PRN/intermittently Type of Home: House Home Access: Stairs to enter Entrance Stairs-Rails: Right;Left;Can reach both Entrance Stairs-Number of Steps: 6 Alternate Level Stairs-Number of Steps: 7 Home Layout: Multi-level Home Equipment: Conservation officer, nature (2 wheels);Cane - single point;Shower seat - built in      Prior Function Prior Level of Function : Independent/Modified Independent;Driving;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[employer])  Mobility Comments: pt reports ind with SPC for community distances ADLs Comments: pt reports ind with ADLs/IADLs     Hand Dominance   Dominant Hand: Right    Extremity/Trunk Assessment   Upper Extremity Assessment Upper Extremity Assessment: Overall WFL for tasks assessed    Lower Extremity Assessment Lower Extremity Assessment: LLE deficits/detail LLE Deficits / Details: ankle AROM WFL, knee able to perform quad set and SLR ~2 inches off of bed without extensor lag, knee flexion ~75 deg LLE Sensation: WNL LLE Coordination: WNL    Cervical / Trunk Assessment Cervical / Trunk Assessment: Normal  Communication   Communication: No difficulties  Cognition Arousal/Alertness: Awake/alert Behavior During Therapy: WFL for tasks assessed/performed Overall Cognitive Status: Within Functional Limits for tasks assessed     General Comments General comments (skin integrity, edema, etc.): on RA with SpO2 97% and HR in 80s    Exercises Total Joint Exercises Ankle Circles/Pumps: Seated, AROM, Both, 20 reps Quad Sets: Supine, AROM, Strengthening, Both,  5 reps   Assessment/Plan    PT Assessment Patient needs continued PT services  PT Problem List Decreased strength;Decreased range of motion;Decreased activity tolerance;Decreased balance;Decreased mobility;Decreased knowledge of use of DME;Decreased safety awareness;Pain       PT Treatment Interventions DME instruction;Gait training;Stair training;Functional mobility training;Therapeutic activities;Therapeutic exercise;Balance training;Patient/family education    PT Goals (Current goals can be found in the Care Plan section)  Acute Rehab PT Goals Patient Stated Goal: return home with daughter and friend to assist PT Goal Formulation: With patient Time For Goal Achievement: 07/17/22 Potential to Achieve Goals: Good    Frequency 7X/week     Co-evaluation               AM-PAC PT "6 Clicks" Mobility  Outcome Measure Help needed turning from your back to your side while in a flat bed without using bedrails?: A Little Help needed moving from lying on your back to sitting on the side of a flat bed without using bedrails?: A Little Help needed moving to and from a bed to a chair (including a wheelchair)?: A Little Help needed standing up from a chair using your arms (e.g., wheelchair or bedside chair)?: A Little Help needed to walk in hospital room?: A Little Help needed climbing 3-5 steps with a railing? : A Lot 6 Click Score: 17    End of Session Equipment Utilized During Treatment: Gait belt Activity Tolerance: Patient tolerated treatment well Patient left: in chair;with call bell/phone within reach;with chair alarm set;with family/visitor present Nurse Communication: Mobility status PT Visit Diagnosis: Other abnormalities of gait and mobility (R26.89);Muscle weakness (generalized) (M62.81);Pain  Pain - Right/Left: Left Pain - part of body: Knee    Time: 1335-1404 PT Time Calculation (min) (ACUTE ONLY): 29 min   Charges:   PT Evaluation $PT Eval Low Complexity: 1  Low           Tori Modena Bellemare PT, DPT 07/03/22, 2:33 PM

## 2022-07-03 NOTE — Care Plan (Signed)
Ortho Bundle Case Management Note  Patient Details  Name: Sharon Fitzgerald MRN: [id number] Date of Birth: 1940-11-01                  L TKA on 07/03/22. DCP: Home with daughter and son's MIL. DME: No needs. Has RW. PT: EO 12/1   DME Arranged:  N/A DME Agency:     HH Arranged:    [place] Agency:     Additional Comments: Please contact me with any questions of if this plan should need to change.  Dario Ave, Case Manager  [facility]  [phone] 07/03/2022, 10:39 AM

## 2022-07-04 ENCOUNTER — Encounter (HOSPITAL_COMMUNITY): Payer: Self-pay | Admitting: Orthopedic Surgery

## 2022-07-04 DIAGNOSIS — Z96651 Presence of right artificial knee joint: Secondary | ICD-10-CM | POA: Diagnosis not present

## 2022-07-04 DIAGNOSIS — Z96643 Presence of artificial hip joint, bilateral: Secondary | ICD-10-CM | POA: Diagnosis not present

## 2022-07-04 DIAGNOSIS — M1712 Unilateral primary osteoarthritis, left knee: Secondary | ICD-10-CM | POA: Diagnosis not present

## 2022-07-04 DIAGNOSIS — N189 Chronic kidney disease, unspecified: Secondary | ICD-10-CM | POA: Diagnosis not present

## 2022-07-04 DIAGNOSIS — I129 Hypertensive chronic kidney disease with stage 1 through stage 4 chronic kidney disease, or unspecified chronic kidney disease: Secondary | ICD-10-CM | POA: Diagnosis not present

## 2022-07-04 DIAGNOSIS — Z79899 Other long term (current) drug therapy: Secondary | ICD-10-CM | POA: Diagnosis not present

## 2022-07-04 LAB — CBC
HCT: 29.1 % — ABNORMAL LOW (ref 36.0–46.0)
Hemoglobin: 9.1 g/dL — ABNORMAL LOW (ref 12.0–15.0)
MCH: 30.2 pg (ref 26.0–34.0)
MCHC: 31.3 g/dL (ref 30.0–36.0)
MCV: 96.7 fL (ref 80.0–100.0)
Platelets: 256 10*3/uL (ref 150–400)
RBC: 3.01 MIL/uL — ABNORMAL LOW (ref 3.87–5.11)
RDW: 12.3 % (ref 11.5–15.5)
WBC: 14.3 10*3/uL — ABNORMAL HIGH (ref 4.0–10.5)
nRBC: 0 % (ref 0.0–0.2)

## 2022-07-04 LAB — BASIC METABOLIC PANEL
Anion gap: 7 (ref 5–15)
BUN: 26 mg/dL — ABNORMAL HIGH (ref 8–23)
CO2: 23 mmol/L (ref 22–32)
Calcium: 8.3 mg/dL — ABNORMAL LOW (ref 8.9–10.3)
Chloride: 110 mmol/L (ref 98–111)
Creatinine, Ser: 1.42 mg/dL — ABNORMAL HIGH (ref 0.44–1.00)
GFR, Estimated: 37 mL/min — ABNORMAL LOW (ref >60)
Glucose, Bld: 154 mg/dL — ABNORMAL HIGH (ref 70–99)
Potassium: 3.5 mmol/L (ref 3.5–5.1)
Sodium: 140 mmol/L (ref 135–145)

## 2022-07-04 MED ORDER — METHOCARBAMOL 500 MG PO TABS: 500.0000 mg | ORAL_TABLET | Freq: Four times a day (QID) | ORAL | 0 refills | Status: AC | PRN

## 2022-07-04 MED ORDER — ASPIRIN 81 MG PO CHEW: 81.0000 mg | CHEWABLE_TABLET | Freq: Two times a day (BID) | ORAL | 0 refills | Status: AC

## 2022-07-04 MED ORDER — POLYETHYLENE GLYCOL 3350 17 G PO PACK: 17.0000 g | PACK | Freq: Two times a day (BID) | ORAL | 0 refills | Status: AC

## 2022-07-04 MED ORDER — SENNA 8.6 MG PO TABS: 1.0000 | ORAL_TABLET | Freq: Every day | ORAL | 0 refills | Status: AC

## 2022-07-04 MED ORDER — OXYCODONE HCL 5 MG PO TABS: 5.0000 mg | ORAL_TABLET | ORAL | 0 refills | Status: AC | PRN

## 2022-07-04 NOTE — Plan of Care (Signed)
Pt ready to DC home with daughter. 

## 2022-07-04 NOTE — Progress Notes (Signed)
   Subjective: 1 Day Post-Op Procedure(s) (LRB): TOTAL KNEE ARTHROPLASTY (Left) Patient reports pain as mild.   Patient seen in rounds with Dr. Alvan Dame. Patient is well, and has had no acute complaints or problems. Patient was assisted to the floor after losing her balance yesterday, but did not sustain any injuries. She was able to ambulate 40 feet with PT. Foley catheter removed.  We will start therapy today.   Objective: Vital signs in last 24 hours: Temp:  [97.5 F (36.4 C)-98.2 F (36.8 C)] 98.2 F (36.8 C) (11/29 0557) Pulse Rate:  [65-80] 74 (11/29 0557) Resp:  [13-18] 17 (11/29 0557) BP: (101-158)/(59-102) 138/81 (11/29 0557) SpO2:  [92 %-100 %] 96 % (11/29 0557) Weight:  [88 kg] 88 kg (11/28 1030)  Intake/Output from previous day:  Intake/Output Summary (Last 24 hours) at 07/04/2022 0747 Last data filed at 07/04/2022 0600 Gross per 24 hour  Intake 3285.88 ml  Output 1350 ml  Net 1935.88 ml     Intake/Output this shift: No intake/output data recorded.  Labs: Recent Labs    07/04/22 0323  HGB 9.1*   Recent Labs    07/04/22 0323  WBC 14.3*  RBC 3.01*  HCT 29.1*  PLT 256   Recent Labs    07/04/22 0323  NA 140  K 3.5  CL 110  CO2 23  BUN 26*  CREATININE 1.42*  GLUCOSE 154*  CALCIUM 8.3*   No results for input(s): "LABPT", "INR" in the last 72 hours.  Exam: General - Patient is Alert and Oriented Extremity - Neurologically intact Sensation intact distally Intact pulses distally Dorsiflexion/Plantar flexion intact Dressing - dressing C/D/I Motor Function - intact, moving foot and toes well on exam.   Past Medical History:  Diagnosis Date   Anemia    Arthritis    Carpal tunnel syndrome on right    CKD (chronic kidney disease)    Hyperlipidemia    Hypertension    Obese    Spinal stenosis    sees Dr. Rolena Infante    Assessment/Plan: 1 Day Post-Op Procedure(s) (LRB): TOTAL KNEE ARTHROPLASTY (Left) Principal Problem:   S/P total knee  arthroplasty, left  Estimated body mass index is 34.38 kg/m as calculated from the following:   Height as of this encounter: 5' 2.99" (1.6 m).   Weight as of this encounter: 88 kg. Advance diet Up with therapy D/C IV fluids   Patient's anticipated LOS is less than 2 midnights, meeting these requirements: - Younger than 48 - Lives within 1 hour of care - Has a competent adult at home to recover with post-op recover - NO history of  - Chronic pain requiring opiods  - Diabetes  - Coronary Artery Disease  - Heart failure  - Heart attack  - Stroke  - DVT/VTE  - Cardiac arrhythmia  - Respiratory Failure/COPD  - Renal failure  - Anemia  - Advanced Liver disease     DVT Prophylaxis - Aspirin Weight bearing as tolerated.  Hgb stable at 9.1 this AM. Cr. 1.42 - no NSAIDs.   Plan is to go Home after hospital stay. Plan for discharge today following 1-2 sessions of PT as long as they are meeting their goals. Patient is scheduled for OPPT. Follow up in the office in 2 weeks.   Griffith Citron, PA-C Orthopedic Surgery [phone] 07/04/2022, 7:47 AM

## 2022-07-04 NOTE — Progress Notes (Signed)
Physical Therapy Treatment Patient Details Name: Sharon Fitzgerald MRN: [id number] DOB: 1940/11/15 Today's Date: 07/04/2022   History of Present Illness Sharon Fitzgerald is an 81 yo female s/p L TKA 11/28. PMH: arthritis, CKD, HTN, obese, spinal stenosis, R THA 01/2019, L THA 12/2019 and R TKA 01/2013    PT Comments    Pt seated EOB, dressed, daughter at bedside, pt eager to go home. Pt ambulates in hallway with RW 120 ft and supv, no knee buckling, step-to pattern, trunk slightly flexed due to history of spinal stenosis. Educated pt on sequencing to ascend and descend stairs, pt able to perform correctly with bil handrail, supv. Pt confident with stairs, declines further training. Provided written/illustrated HEP and educated pt on reps, sets, and technique. Pt with good family support, all questions answered, ready to d/c home with daughter and friend to assist as needed and starting OPPT this week.   Recommendations for follow up therapy are one component of a multi-disciplinary discharge planning process, led by the attending physician.  Recommendations may be updated based on patient status, additional functional criteria and insurance authorization.  Follow Up Recommendations  Follow physician's recommendations for discharge plan and follow up therapies     Assistance Recommended at Discharge Frequent or constant Supervision/Assistance  Patient can return home with the following A little help with walking and/or transfers;A little help with bathing/dressing/bathroom;Assistance with cooking/housework;Assist for transportation;Help with stairs or ramp for entrance   Equipment Recommendations  None recommended by PT    Recommendations for Other Services       Precautions / Restrictions Precautions Precautions: Fall;Knee Restrictions Weight Bearing Restrictions: No LLE Weight Bearing: Weight bearing as tolerated     Mobility  Bed Mobility  General bed mobility comments: pt seated EOB  upon arrival    Transfers Overall transfer level: Needs assistance Equipment used: Rolling walker (2 wheels) Transfers: Sit to/from Stand Sit to Stand: Supervision  General transfer comment: pt powers to stand with BUE assisting, supv for safety    Ambulation/Gait Ambulation/Gait assistance: Supervision Gait Distance (Feet): 120 Feet Assistive device: Rolling walker (2 wheels) Gait Pattern/deviations: Step-to pattern, Decreased stride length, Decreased stance time - left Gait velocity: decreased  General Gait Details: setp-to pattern, decreased LLE stance time, no knee buckling, increased time and steps with turns   Stairs Stairs: Yes Stairs assistance: Supervision Stair Management: Two rails, Step to pattern Number of Stairs: 4 General stair comments: step to pattern, able to sequence correctly, declines further stair training   Wheelchair Mobility    Modified Rankin (Stroke Patients Only)       Balance Overall balance assessment: Needs assistance Sitting-balance support: Feet supported Sitting balance-Leahy Scale: Good  Standing balance support: During functional activity, Bilateral upper extremity supported, Reliant on assistive device for balance Standing balance-Leahy Scale: Poor     Cognition Arousal/Alertness: Awake/alert Behavior During Therapy: WFL for tasks assessed/performed Overall Cognitive Status: Within Functional Limits for tasks assessed     Exercises      General Comments        Pertinent Vitals/Pain Pain Assessment Pain Assessment: 0-10 Pain Score: 5  Pain Location: L knee Pain Descriptors / Indicators: Sore Pain Intervention(s): Limited activity within patient's tolerance, Monitored during session, Premedicated before session, Repositioned (pt declined ice)    Home Living                          Prior Function  PT Goals (current goals can now be found in the care plan section) Acute Rehab PT Goals Patient  Stated Goal: return home with daughter and friend to assist PT Goal Formulation: With patient Time For Goal Achievement: 07/17/22 Potential to Achieve Goals: Good Progress towards PT goals: Progressing toward goals    Frequency    7X/week      PT Plan Current plan remains appropriate    Co-evaluation              AM-PAC PT "6 Clicks" Mobility   Outcome Measure  Help needed turning from your back to your side while in a flat bed without using bedrails?: A Little Help needed moving from lying on your back to sitting on the side of a flat bed without using bedrails?: A Little Help needed moving to and from a bed to a chair (including a wheelchair)?: A Little Help needed standing up from a chair using your arms (e.g., wheelchair or bedside chair)?: A Little Help needed to walk in hospital room?: A Little Help needed climbing 3-5 steps with a railing? : A Little 6 Click Score: 18    End of Session Equipment Utilized During Treatment: Gait belt Activity Tolerance: Patient tolerated treatment well Patient left: in bed;with call bell/phone within reach;with family/visitor present Nurse Communication: Mobility status PT Visit Diagnosis: Other abnormalities of gait and mobility (R26.89);Muscle weakness (generalized) (M62.81);Pain Pain - Right/Left: Left Pain - part of body: Knee     Time: 2233-6122 PT Time Calculation (min) (ACUTE ONLY): 17 min  Charges:  $Gait Training: 8-22 mins                      Tori Irine Heminger PT, DPT 07/04/22, 12:02 PM

## 2022-07-04 NOTE — TOC Transition Note (Signed)
Transition of Care [facility]) - CM/SW Discharge Note   Patient Details  Name: Sharon Fitzgerald MRN: [id number] Date of Birth: Dec 10, 1940  Transition of [facility] CM/SW Contact:  Lennart Pall, LCSW Phone Number: 07/04/2022, 10:38 AM   Clinical Narrative:    Met with pt and confirming she has needed DME at home.  OPPT arranged with [facility].  No TOC needs.   Final next level of care: OP Rehab Barriers to Discharge: No Barriers Identified   Patient Goals and CMS Choice Patient states their goals for this hospitalization and ongoing recovery are:: return home      Discharge Placement                       Discharge Plan and Services                DME Arranged: N/A                    Social Determinants of Health (SDOH) Interventions     Readmission Risk Interventions     No data to display

## 2022-07-10 NOTE — Discharge Summary (Signed)
Patient ID: Sharon Fitzgerald MRN: [id number] DOB/AGE: 10-Oct-1940 81 y.o.  Admit date: 07/03/2022 Discharge date: 07/04/2022  Admission Diagnoses:  Left knee osteoarthritis  Discharge Diagnoses:  Principal Problem:   S/P total knee arthroplasty, left   Past Medical History:  Diagnosis Date   Anemia    Arthritis    Carpal tunnel syndrome on right    CKD (chronic kidney disease)    Hyperlipidemia    Hypertension    Obese    Spinal stenosis    sees Dr. Rolena Infante    Surgeries: Procedure(s): TOTAL KNEE ARTHROPLASTY on 07/03/2022   Consultants:   Discharged Condition: Improved  Hospital Course: Sharon Fitzgerald is an 81 y.o. female who was admitted 07/03/2022 for operative treatment ofS/P total knee arthroplasty, left. Patient has severe unremitting pain that affects sleep, daily activities, and work/hobbies. After pre-op clearance the patient was taken to the operating room on 07/03/2022 and underwent  Procedure(s): TOTAL KNEE ARTHROPLASTY.    Patient was given perioperative antibiotics:  Anti-infectives (From admission, onward)    Start     Dose/Rate Route Frequency Ordered Stop   07/03/22 1300  ceFAZolin (ANCEF) IVPB 2g/100 mL premix        2 g 200 mL/hr over 30 Minutes Intravenous Every 6 hours 07/03/22 1030 07/03/22 2026   07/03/22 0600  ceFAZolin (ANCEF) IVPB 2g/100 mL premix        2 g 200 mL/hr over 30 Minutes Intravenous On call to O.R. 07/03/22 0530 07/03/22 0755        Patient was given sequential compression devices, early ambulation, and chemoprophylaxis to prevent DVT. Patient worked with PT and was meeting their goals regarding safe ambulation and transfers.  Patient benefited maximally from hospital stay and there were no complications.    Recent vital signs: No data found.   Recent laboratory studies: No results for input(s): "WBC", "HGB", "HCT", "PLT", "NA", "K", "CL", "CO2", "BUN", "CREATININE", "GLUCOSE", "INR", "CALCIUM" in the last 72  hours.  Invalid input(s): "PT", "2"   Discharge Medications:   Allergies as of 07/04/2022   No Known Allergies      Medication List     TAKE these medications    acetaminophen 500 MG tablet Commonly known as: TYLENOL Take 500-1,000 mg by mouth every 6 (six) hours as needed (pain.).   amLODipine 5 MG tablet Commonly known as: NORVASC Take 5 mg by mouth in the morning.   aspirin 81 MG chewable tablet Chew 1 tablet (81 mg total) by mouth 2 (two) times daily for 28 days.   cholecalciferol 25 MCG (1000 UNIT) tablet Commonly known as: VITAMIN D3 Take 1,000 Units by mouth in the morning.   ferrous sulfate 325 (65 FE) MG tablet Take 650 mg by mouth in the morning.   lisinopril 20 MG tablet Commonly known as: ZESTRIL Take 20 mg by mouth in the morning.   methocarbamol 500 MG tablet Commonly known as: ROBAXIN Take 1 tablet (500 mg total) by mouth every 6 (six) hours as needed for muscle spasms.   oxyCODONE 5 MG immediate release tablet Commonly known as: Oxy IR/ROXICODONE Take 1 tablet (5 mg total) by mouth every 4 (four) hours as needed for severe pain.   polyethylene glycol 17 g packet Commonly known as: MIRALAX / GLYCOLAX Take 17 g by mouth 2 (two) times daily.   pravastatin 20 MG tablet Commonly known as: PRAVACHOL Take 20 mg by mouth in the morning.   senna 8.6 MG Tabs tablet Commonly known as:  SENOKOT Take 1 tablet (8.6 mg total) by mouth at bedtime for 14 days.   VITAMIN B-12 SL Take 1 tablet by mouth in the morning.               Discharge Care Instructions  (From admission, onward)           Start     Ordered   07/04/22 0000  Change dressing       Comments: Maintain surgical dressing until follow up in the clinic. If the edges start to pull up, may reinforce with tape. If the dressing is no longer working, may remove and cover with gauze and tape, but must keep the area dry and clean.  Call with any questions or concerns.   07/04/22 0750             Diagnostic Studies: No results found.  Disposition: Discharge disposition: 01-Home or Self Care       Discharge Instructions     Call MD / Call 911   Complete by: As directed    If you experience chest pain or shortness of breath, CALL 911 and be transported to the hospital emergency room.  If you develope a fever above 101 F, pus (white drainage) or increased drainage or redness at the wound, or calf pain, call your surgeon's office.   Change dressing   Complete by: As directed    Maintain surgical dressing until follow up in the clinic. If the edges start to pull up, may reinforce with tape. If the dressing is no longer working, may remove and cover with gauze and tape, but must keep the area dry and clean.  Call with any questions or concerns.   Constipation Prevention   Complete by: As directed    Drink plenty of fluids.  Prune juice may be helpful.  You may use a stool softener, such as Colace (over the counter) 100 mg twice a day.  Use MiraLax (over the counter) for constipation as needed.   Diet - low sodium heart healthy   Complete by: As directed    Increase activity slowly as tolerated   Complete by: As directed    Weight bearing as tolerated with assist device (walker, cane, etc) as directed, use it as long as suggested by your surgeon or therapist, typically at least 4-6 weeks.   Post-operative opioid taper instructions:   Complete by: As directed    POST-OPERATIVE OPIOID TAPER INSTRUCTIONS: It is important to wean off of your opioid medication as soon as possible. If you do not need pain medication after your surgery it is ok to stop day one. Opioids include: Codeine, Hydrocodone(Norco, Vicodin), Oxycodone(Percocet, oxycontin) and hydromorphone amongst others.  Long term and even short term use of opiods can cause: Increased pain response Dependence Constipation Depression Respiratory depression And more.  Withdrawal symptoms can include Flu like  symptoms Nausea, vomiting And more Techniques to manage these symptoms Hydrate well Eat regular healthy meals Stay active Use relaxation techniques(deep breathing, meditating, yoga) Do Not substitute Alcohol to help with tapering If you have been on opioids for less than two weeks and do not have pain than it is ok to stop all together.  Plan to wean off of opioids This plan should start within one week post op of your joint replacement. Maintain the same interval or time between taking each dose and first decrease the dose.  Cut the total daily intake of opioids by one tablet each day Next start to  increase the time between doses. The last dose that should be eliminated is the evening dose.      TED hose   Complete by: As directed    Use stockings (TED hose) for 2 weeks on both leg(s).  You may remove them at night for sleeping.        Follow-up Information     Paralee Cancel, MD. Go on 07/18/2022.   Specialty: Orthopedic Surgery Why: You are scheduled for follow up on Wednesday December 13th at 2:30pm. Contact information: [address] [place] [address] [phone]         Rosilyn Mings.. Go on 07/06/2022.   Why: You are scheduled for physical therapy eval on Friday December 1st at 10:15am. Contact information: [place] [address] [phone]                  Signed: Irving Copas 07/10/2022, 1:30 PM

## 2022-07-16 ENCOUNTER — Other Ambulatory Visit (HOSPITAL_BASED_OUTPATIENT_CLINIC_OR_DEPARTMENT_OTHER): Payer: Self-pay | Admitting: Family Medicine

## 2022-07-16 ENCOUNTER — Ambulatory Visit (HOSPITAL_BASED_OUTPATIENT_CLINIC_OR_DEPARTMENT_OTHER)
Admission: RE | Admit: 2022-07-16 | Discharge: 2022-07-16 | Disposition: A | Discharge status: Home / Self Care | Payer: Medicare PPO | Source: Ambulatory Visit | Attending: Family Medicine | Admitting: Family Medicine

## 2022-07-16 DIAGNOSIS — R600 Localized edema: Secondary | ICD-10-CM | POA: Diagnosis not present

## 2023-02-05 DIAGNOSIS — Z9181 History of falling: Secondary | ICD-10-CM | POA: Diagnosis not present

## 2023-02-05 DIAGNOSIS — M79632 Pain in left forearm: Secondary | ICD-10-CM | POA: Diagnosis not present

## 2023-02-05 DIAGNOSIS — I10 Essential (primary) hypertension: Secondary | ICD-10-CM | POA: Diagnosis not present

## 2023-02-05 DIAGNOSIS — R29898 Other symptoms and signs involving the musculoskeletal system: Secondary | ICD-10-CM | POA: Diagnosis not present

## 2023-02-05 DIAGNOSIS — R0781 Pleurodynia: Secondary | ICD-10-CM | POA: Diagnosis not present

## 2023-02-08 ENCOUNTER — Other Ambulatory Visit: Payer: Self-pay | Admitting: Family Medicine

## 2023-02-08 ENCOUNTER — Ambulatory Visit
Admission: RE | Admit: 2023-02-08 | Discharge: 2023-02-08 | Disposition: A | Discharge status: Home / Self Care | Payer: Medicare PPO | Source: Ambulatory Visit | Attending: Family Medicine | Admitting: Family Medicine

## 2023-02-08 DIAGNOSIS — R918 Other nonspecific abnormal finding of lung field: Secondary | ICD-10-CM | POA: Diagnosis not present

## 2023-02-08 DIAGNOSIS — Z9181 History of falling: Secondary | ICD-10-CM

## 2023-05-23 DIAGNOSIS — E66812 Obesity, class 2: Secondary | ICD-10-CM | POA: Diagnosis not present

## 2023-05-23 DIAGNOSIS — Z23 Encounter for immunization: Secondary | ICD-10-CM | POA: Diagnosis not present

## 2023-05-23 DIAGNOSIS — Z9989 Dependence on other enabling machines and devices: Secondary | ICD-10-CM | POA: Diagnosis not present

## 2023-05-23 DIAGNOSIS — I10 Essential (primary) hypertension: Secondary | ICD-10-CM | POA: Diagnosis not present

## 2023-05-23 DIAGNOSIS — N183 Chronic kidney disease, stage 3 unspecified: Secondary | ICD-10-CM | POA: Diagnosis not present

## 2023-06-20 DIAGNOSIS — R54 Age-related physical debility: Secondary | ICD-10-CM | POA: Diagnosis not present

## 2023-06-20 DIAGNOSIS — I10 Essential (primary) hypertension: Secondary | ICD-10-CM | POA: Diagnosis not present

## 2023-08-18 IMAGING — US US RENAL
1 series · 14 of 25 positions shown · non-contrast
Comparison: None.

CLINICAL DATA: CKD

EXAM:
RENAL / URINARY TRACT ULTRASOUND COMPLETE

[Series 1: us renal · 0.22mm/px · 14 of 29 slices shown]
[im 1/29]
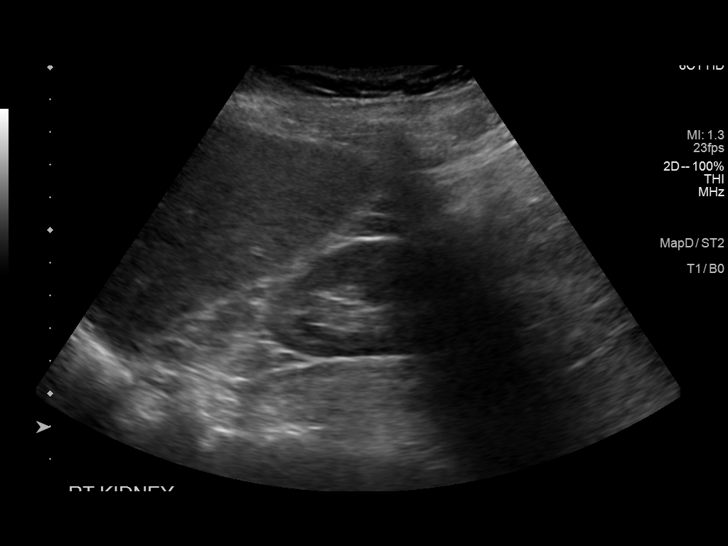
[im 3/29]
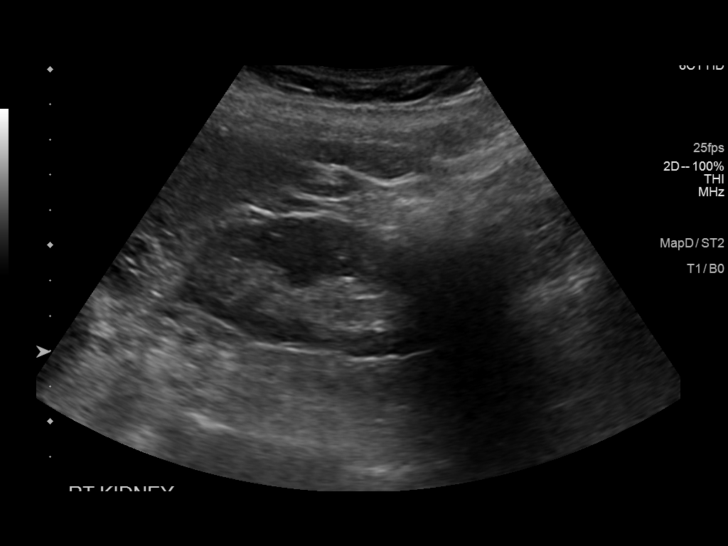
[im 5/29]
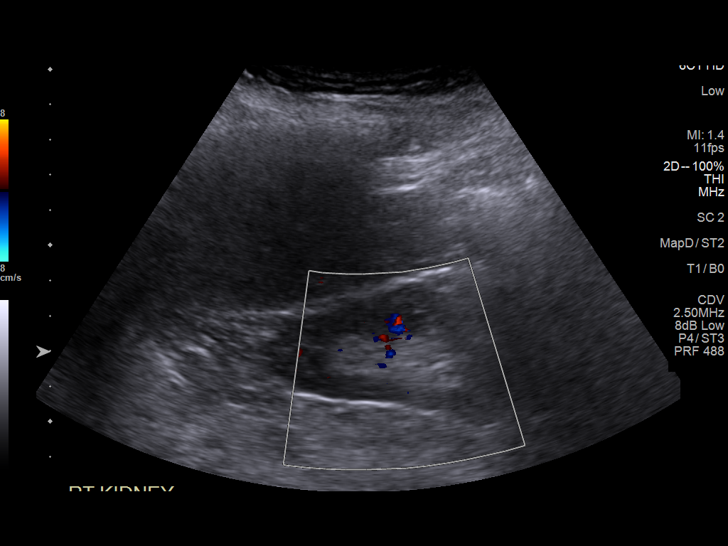
[im 8/29]
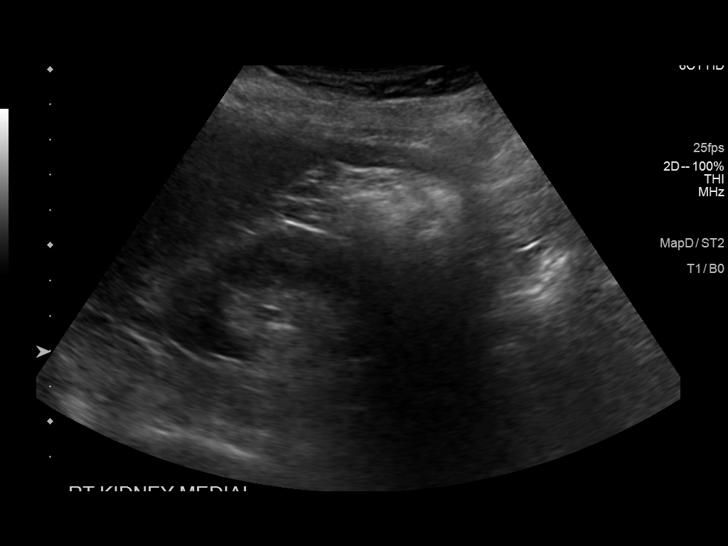
[im 10/29]
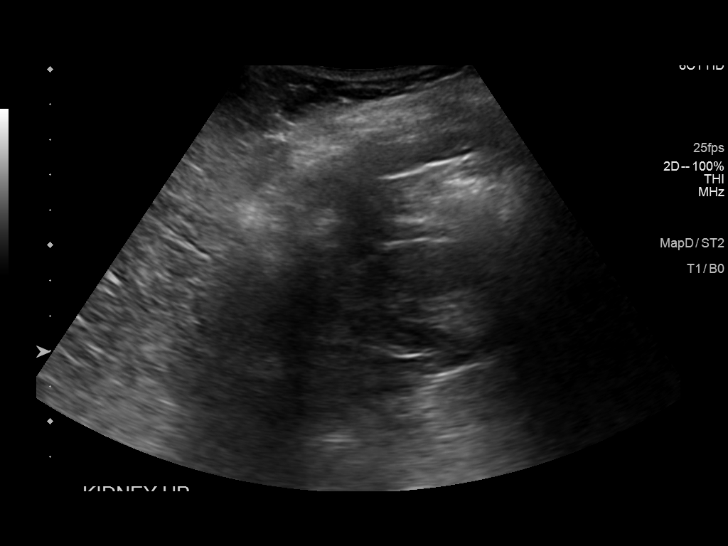
[im 11/29]
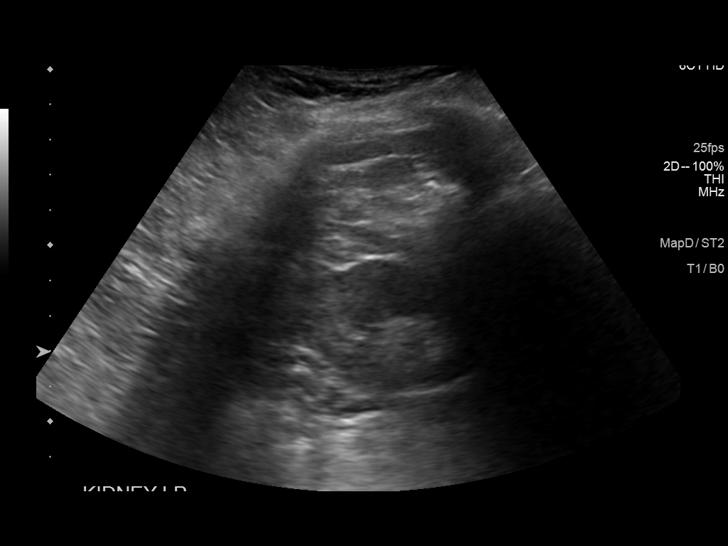
[im 13/29]
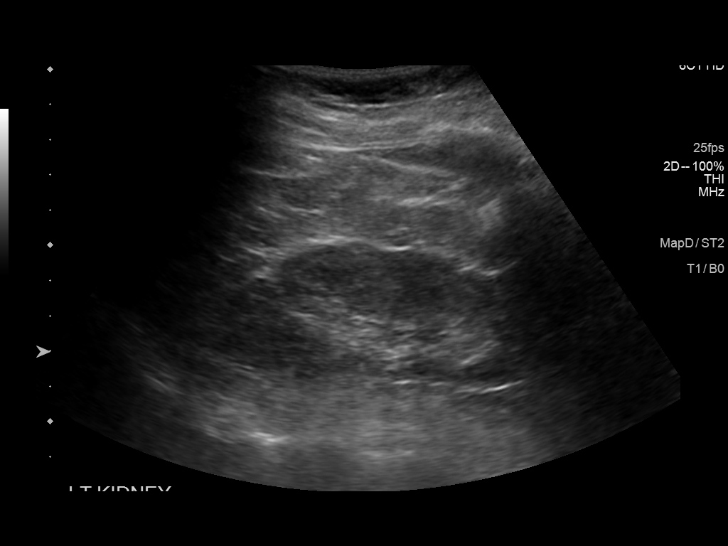
[im 16/29]
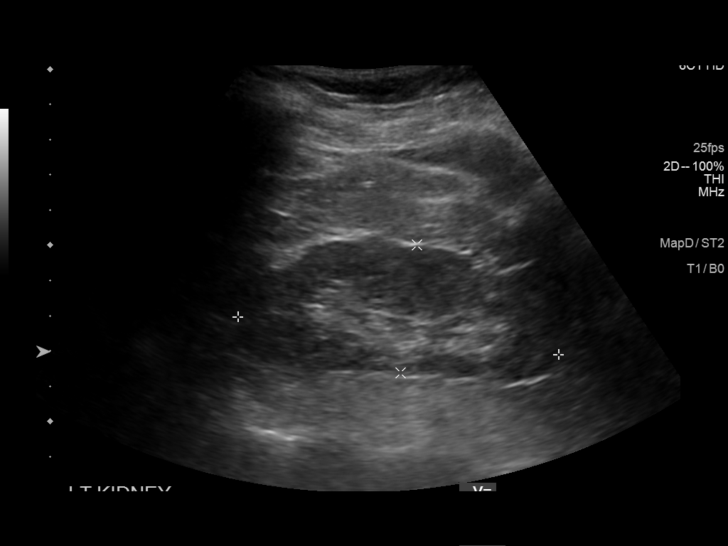
[im 18/29]
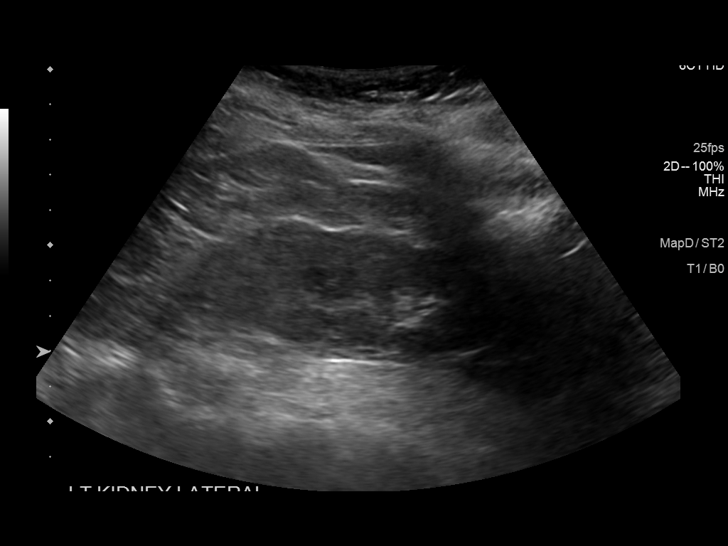
[im 19/29]
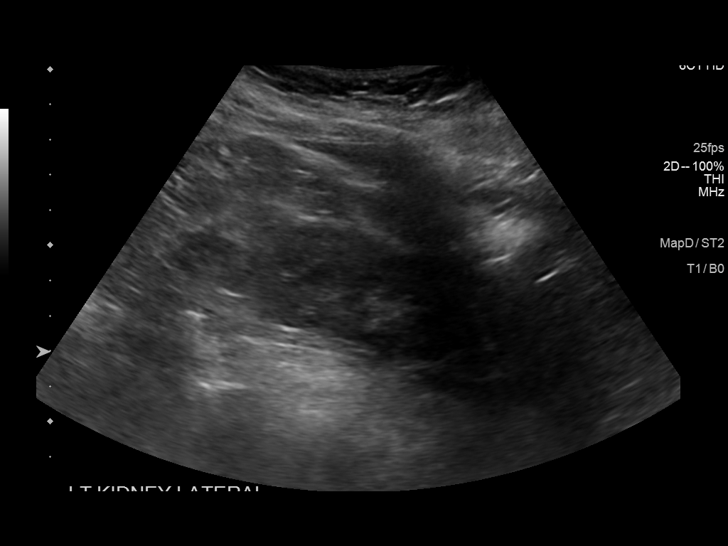
[im 22/29]
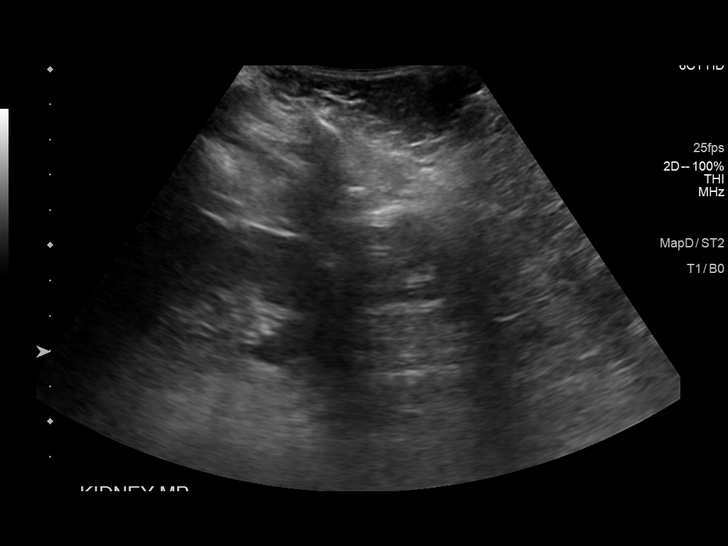
[im 24/29]
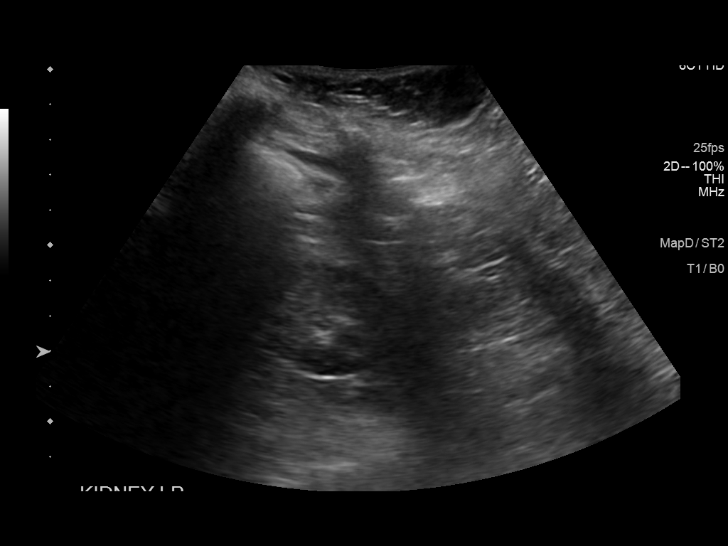
[im 26/29]
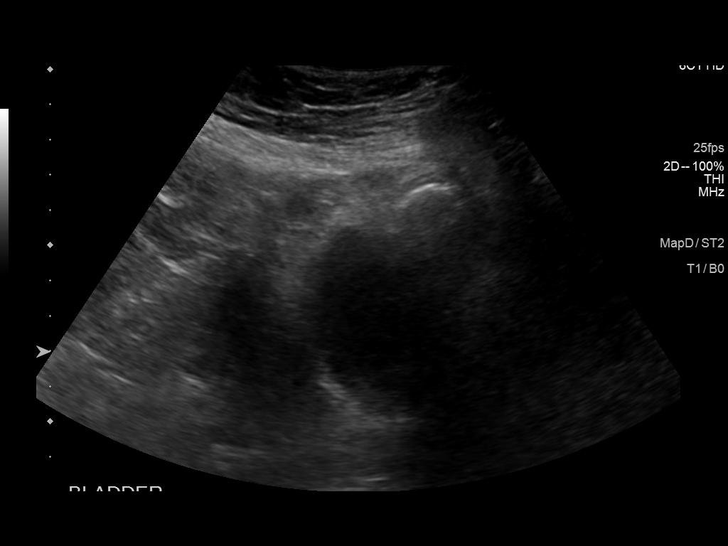
[im 29/29]
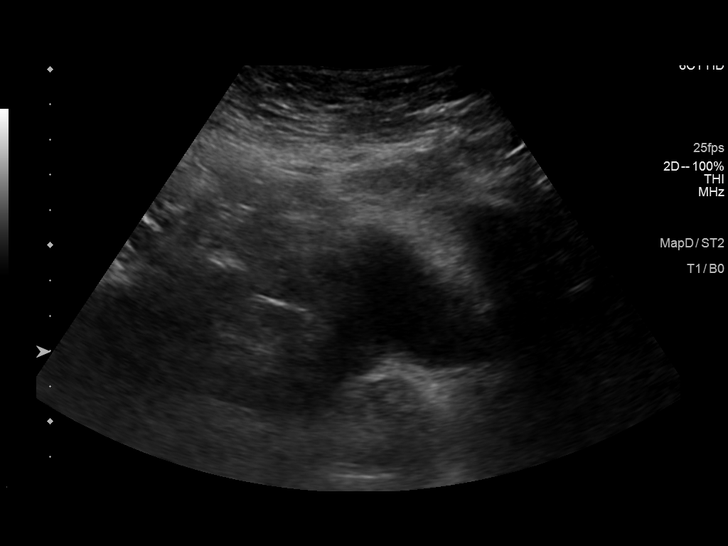

[14 of 25 positions shown; findings below may reference images not displayed]

FINDINGS: Right Kidney:

Renal measurements: 8.0 x 3.7 x 5.3 cm = volume: 82 mL. Echogenicity
within normal limits. No mass or hydronephrosis visualized.

Left Kidney:

Renal measurements: 9.2 x 3.7 x 4.1 cm = volume: 73 mL. Echogenicity
within normal limits. No mass or hydronephrosis visualized.

Bladder:

Appears normal for degree of bladder distention.

Other:

None.
IMPRESSION: No significant sonographic abnormality of the kidneys.

## 2023-11-20 DIAGNOSIS — M7542 Impingement syndrome of left shoulder: Secondary | ICD-10-CM | POA: Diagnosis not present

## 2024-02-13 DIAGNOSIS — E66812 Obesity, class 2: Secondary | ICD-10-CM | POA: Diagnosis not present

## 2024-02-13 DIAGNOSIS — R600 Localized edema: Secondary | ICD-10-CM | POA: Diagnosis not present

## 2024-02-13 DIAGNOSIS — I10 Essential (primary) hypertension: Secondary | ICD-10-CM | POA: Diagnosis not present

## 2024-02-13 DIAGNOSIS — R5383 Other fatigue: Secondary | ICD-10-CM | POA: Diagnosis not present

## 2024-02-13 DIAGNOSIS — E782 Mixed hyperlipidemia: Secondary | ICD-10-CM | POA: Diagnosis not present

## 2024-02-13 DIAGNOSIS — Z1331 Encounter for screening for depression: Secondary | ICD-10-CM | POA: Diagnosis not present

## 2024-02-13 DIAGNOSIS — Z0000 Encounter for general adult medical examination without abnormal findings: Secondary | ICD-10-CM | POA: Diagnosis not present

## 2024-02-13 DIAGNOSIS — N183 Chronic kidney disease, stage 3 unspecified: Secondary | ICD-10-CM | POA: Diagnosis not present

## 2024-02-27 DIAGNOSIS — M545 Low back pain, unspecified: Secondary | ICD-10-CM | POA: Diagnosis not present

## 2024-02-27 DIAGNOSIS — I10 Essential (primary) hypertension: Secondary | ICD-10-CM | POA: Diagnosis not present

## 2024-02-27 DIAGNOSIS — R600 Localized edema: Secondary | ICD-10-CM | POA: Diagnosis not present

## 2024-02-27 DIAGNOSIS — D509 Iron deficiency anemia, unspecified: Secondary | ICD-10-CM | POA: Diagnosis not present

## 2024-03-19 DIAGNOSIS — I10 Essential (primary) hypertension: Secondary | ICD-10-CM | POA: Diagnosis not present

## 2024-03-19 DIAGNOSIS — M545 Low back pain, unspecified: Secondary | ICD-10-CM | POA: Diagnosis not present

## 2024-03-19 DIAGNOSIS — D509 Iron deficiency anemia, unspecified: Secondary | ICD-10-CM | POA: Diagnosis not present

## 2024-04-03 DIAGNOSIS — N2581 Secondary hyperparathyroidism of renal origin: Secondary | ICD-10-CM | POA: Diagnosis not present

## 2024-04-03 DIAGNOSIS — E785 Hyperlipidemia, unspecified: Secondary | ICD-10-CM | POA: Diagnosis not present

## 2024-04-03 DIAGNOSIS — N1832 Chronic kidney disease, stage 3b: Secondary | ICD-10-CM | POA: Diagnosis not present

## 2024-04-03 DIAGNOSIS — I129 Hypertensive chronic kidney disease with stage 1 through stage 4 chronic kidney disease, or unspecified chronic kidney disease: Secondary | ICD-10-CM | POA: Diagnosis not present

## 2024-04-03 DIAGNOSIS — R609 Edema, unspecified: Secondary | ICD-10-CM | POA: Diagnosis not present

## 2024-04-03 DIAGNOSIS — D631 Anemia in chronic kidney disease: Secondary | ICD-10-CM | POA: Diagnosis not present

## 2024-04-16 DIAGNOSIS — N1832 Chronic kidney disease, stage 3b: Secondary | ICD-10-CM | POA: Diagnosis not present

## 2024-04-16 DIAGNOSIS — I129 Hypertensive chronic kidney disease with stage 1 through stage 4 chronic kidney disease, or unspecified chronic kidney disease: Secondary | ICD-10-CM | POA: Diagnosis not present

## 2024-05-01 DIAGNOSIS — M545 Low back pain, unspecified: Secondary | ICD-10-CM | POA: Diagnosis not present

## 2024-05-01 DIAGNOSIS — M47816 Spondylosis without myelopathy or radiculopathy, lumbar region: Secondary | ICD-10-CM | POA: Diagnosis not present

## 2024-05-01 DIAGNOSIS — M4326 Fusion of spine, lumbar region: Secondary | ICD-10-CM | POA: Diagnosis not present

## 2024-05-01 DIAGNOSIS — Z1330 Encounter for screening examination for mental health and behavioral disorders, unspecified: Secondary | ICD-10-CM | POA: Diagnosis not present

## 2024-05-01 NOTE — Progress Notes (Signed)
 [facility] Orthopaedic Spine Surgery Outpatient Visit     Sharon Fitzgerald DOB: 1940-12-17 MRN: [id number] 05/01/2024  Chief Complaint  Patient presents with  . Lower Back - Follow-up, Pain    PCP: No primary care provider on file. Referring Provider: Dyane Dries, FNP  HISTORY: Sharon Fitzgerald is a 83 y.o.   female that is seen today as a initial evaluation. She is being evaluated for her lower back pain. Patient is presenting LBP with pain in the gluteus. Pain started increasing the past few months. She has a previous lumbar surgery at L4-S1. Some symptoms she describes are aching.   Pain is a 3 out of 10. She gets relief when sitting in a chair.  Physical EXAM: VITALS: Ht 5' 3 (1.6 m)   Wt 199 lb (90.3 kg)   BMI 35.25 kg/m  BMI Readings from Last 1 Encounters:  05/01/24 35.25 kg/m   Spine Musculoskeletal Exam  Gait   Gait is normal.  Inspection   Leg length disparity: no discrepancy   Thoracolumbar   Erythema: none   Swelling: none   Edema (right lower extremity): none       Prior incision: midline lumbar  Palpation   Thoracolumbar   Tenderness: present     Paraspinous: right and left  Strength   Thoracolumbar      Right     Tibialis anterior: 5/5.      Tibialis posterior: 5/5.      Plantar flexion: 5/5.      Quadriceps: 5/5.      Hamstring: 5/5.      Hip abductors: 5/5.      Hip flexion: 5/5.      Hip adduction: 5/5.       Left     Tibialis anterior: 5/5.      Tibialis posterior: 5/5.      Plantar flexion: 5/5.      Quadriceps: 5/5.      Hamstring: 5/5.      Hip abductors: 5/5.      Hip flexion: 5/5.      Hip adduction: 5/5.    Sensory   Thoracolumbar     Right     Anterior thigh: normal     Medial thigh: normal     Posterior thigh: normal     Lateral thigh: normal     Anterior knee: normal     Anterior leg: normal     Medial leg: normal     Posterior leg: normal     Lateral leg: normal     Dorsum  foot: normal     Lateral foot: normal     Plantar foot: normal     First web space: normal     Left     Anterior thigh: normal     Medial thigh: normal     Posterior thigh: normal     Lateral thigh: normal     Anterior knee: normal     Anterior leg: normal     Medial leg: normal     Posterior leg: normal     Lateral leg: normal     Dorsum foot: normal     Lateral foot: normal     Plantar foot: normal     First web space: normal  Reflexes   Thoracolumbar reflexes are normal.   Right     Quadriceps: 2/4     Achilles: 2/4    Left     Quadriceps: 2/4  Achilles: 2/4  Special Tests   Thoracolumbar     Right     SLR: back pain and pain radiates to right leg     Left     SLR: back pain   IMAGING: I personally reviewed this/these film(s) and radiology report(s), with my interpretation as listed in the above paragraph(s). 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  She has also collapse of the disc space at T12-L1.   Spine Surgical History: Lumbar fusion L4-S1 2014   Problem LIst:   ICD-10-CM   1. Acute bilateral low back pain, unspecified whether sciatica present  M54.50 XR Spine Lumbar Min 4 Views    2. Fusion of lumbar spine  M43.26     3. Lumbar spondylosis  M47.816       Assessment/Plan:  83 year old female patient that is seen today as initial evaluation.  Patient has a main complaint of mechanical low back pain.  She is not complaining of radiculopathy.  Symptoms are secondary to lumbar spondylosis on the lumbar spine.  X-rays performed today show adjacent segment disease with lumbar spondylosis of the facet joints at L3-4 without high-grade spondylolisthesis.  She has also collapse of the disc space at T12-L1.  Patient is having mechanical pain that changes on sitting to standing.  She feels well when she is sitting but she feels significant pain when she standing.  Patient  has sagittal imbalance that is increasing the stress on the joints.  I will order lumbar MRI for further assessment.  Patient candidate for medial branch blocks possible RFA depending on MRI review.   Follow up in about 5 weeks (around 06/05/2024) for for MRI review, With Dr. Marlyce.  Orders Placed This Encounter  Procedures  . XR Spine Lumbar Min 4 Views    Special Views to be Performed:   AP & Lateral    Special Views to be Performed:   Flexion, Extension    On the day of the encounter, a total of 45 minutes were spent on this patient encounter including history, physical exam, review of historical information, review of imaging, assessment, plan, documentation.   Electronically signed by Donnajean Marlyce, MD on 05/01/2024 at 1:53 PM

## 2024-06-08 DIAGNOSIS — M4805 Spinal stenosis, thoracolumbar region: Secondary | ICD-10-CM | POA: Diagnosis not present

## 2024-06-08 DIAGNOSIS — M47816 Spondylosis without myelopathy or radiculopathy, lumbar region: Secondary | ICD-10-CM | POA: Diagnosis not present

## 2024-06-08 DIAGNOSIS — M48061 Spinal stenosis, lumbar region without neurogenic claudication: Secondary | ICD-10-CM | POA: Diagnosis not present

## 2024-06-08 DIAGNOSIS — M4804 Spinal stenosis, thoracic region: Secondary | ICD-10-CM | POA: Diagnosis not present

## 2024-06-08 DIAGNOSIS — M5135 Other intervertebral disc degeneration, thoracolumbar region: Secondary | ICD-10-CM | POA: Diagnosis not present

## 2024-06-08 DIAGNOSIS — Z981 Arthrodesis status: Secondary | ICD-10-CM | POA: Diagnosis not present

## 2024-06-08 DIAGNOSIS — M4316 Spondylolisthesis, lumbar region: Secondary | ICD-10-CM | POA: Diagnosis not present

## 2024-06-16 DIAGNOSIS — E785 Hyperlipidemia, unspecified: Secondary | ICD-10-CM | POA: Diagnosis not present

## 2024-06-16 DIAGNOSIS — D631 Anemia in chronic kidney disease: Secondary | ICD-10-CM | POA: Diagnosis not present

## 2024-06-16 DIAGNOSIS — R609 Edema, unspecified: Secondary | ICD-10-CM | POA: Diagnosis not present

## 2024-06-16 DIAGNOSIS — N1832 Chronic kidney disease, stage 3b: Secondary | ICD-10-CM | POA: Diagnosis not present

## 2024-06-16 DIAGNOSIS — I129 Hypertensive chronic kidney disease with stage 1 through stage 4 chronic kidney disease, or unspecified chronic kidney disease: Secondary | ICD-10-CM | POA: Diagnosis not present

## 2024-06-16 DIAGNOSIS — N2581 Secondary hyperparathyroidism of renal origin: Secondary | ICD-10-CM | POA: Diagnosis not present

## 2024-06-22 DIAGNOSIS — D509 Iron deficiency anemia, unspecified: Secondary | ICD-10-CM | POA: Diagnosis not present

## 2024-06-22 DIAGNOSIS — M4800 Spinal stenosis, site unspecified: Secondary | ICD-10-CM | POA: Diagnosis not present

## 2024-06-22 DIAGNOSIS — M545 Low back pain, unspecified: Secondary | ICD-10-CM | POA: Diagnosis not present

## 2024-06-22 DIAGNOSIS — N183 Chronic kidney disease, stage 3 unspecified: Secondary | ICD-10-CM | POA: Diagnosis not present

## 2024-06-22 DIAGNOSIS — Z23 Encounter for immunization: Secondary | ICD-10-CM | POA: Diagnosis not present

## 2024-06-22 DIAGNOSIS — R600 Localized edema: Secondary | ICD-10-CM | POA: Diagnosis not present

## 2024-06-22 DIAGNOSIS — I10 Essential (primary) hypertension: Secondary | ICD-10-CM | POA: Diagnosis not present

## 2024-06-30 DIAGNOSIS — M5432 Sciatica, left side: Secondary | ICD-10-CM | POA: Diagnosis not present

## 2024-06-30 DIAGNOSIS — M47816 Spondylosis without myelopathy or radiculopathy, lumbar region: Secondary | ICD-10-CM | POA: Diagnosis not present

## 2024-06-30 DIAGNOSIS — M4326 Fusion of spine, lumbar region: Secondary | ICD-10-CM | POA: Diagnosis not present
